# Patient Record
Sex: Male | Born: 1955 | Race: White | Hispanic: No | Smoking: Never smoker
Health system: Southern US, Community
[De-identification: ages and names within clinical notes are randomized; demographics above are authoritative.]

---

## 2014-06-28 ENCOUNTER — Ambulatory Visit (INDEPENDENT_AMBULATORY_CARE_PROVIDER_SITE_OTHER): Payer: BLUE CROSS/BLUE SHIELD

## 2014-06-28 ENCOUNTER — Ambulatory Visit (INDEPENDENT_AMBULATORY_CARE_PROVIDER_SITE_OTHER): Payer: BLUE CROSS/BLUE SHIELD | Admitting: Sports Medicine

## 2014-06-28 ENCOUNTER — Encounter: Payer: Self-pay | Admitting: Sports Medicine

## 2014-06-28 VITALS — BP 176/91 | HR 76 | Ht 64.0 in | Wt 212.0 lb

## 2014-06-28 DIAGNOSIS — M25511 Pain in right shoulder: Secondary | ICD-10-CM | POA: Diagnosis not present

## 2014-06-28 DIAGNOSIS — M7022 Olecranon bursitis, left elbow: Secondary | ICD-10-CM | POA: Diagnosis not present

## 2014-06-28 DIAGNOSIS — M65811 Other synovitis and tenosynovitis, right shoulder: Secondary | ICD-10-CM

## 2014-06-28 DIAGNOSIS — M65911 Unspecified synovitis and tenosynovitis, right shoulder: Secondary | ICD-10-CM | POA: Insufficient documentation

## 2014-06-28 MED ORDER — MELOXICAM 15 MG PO TABS: ORAL_TABLET | ORAL | Status: DC

## 2014-06-28 NOTE — Assessment & Plan Note (Signed)
This has since resolved. He does have a elbow protective sleeve that he will start wearing on the left side. If no better in several weeks I can inject the remnant of his olecranon bursa.

## 2014-06-28 NOTE — Progress Notes (Signed)
   Subjective:    I'm seeing this patient as a consultation for:  Dr. Angelena SoleWeston Saunders  CC: 2 complaints  HPI: Left elbow pain: After bumping it against a hard railing, swelled up with what sounded to be an olecranon bursitis, then self resolved. Symptoms are now mild and improving. He has not been using any elbow protection and only has mild pain when resting his left elbow on a table.  Right shoulder pain: Moderate, persistent, localized over the anterolateral shoulder, worse with overhead activities, no radiation, minimal neck pain.  Past medical history, Surgical history, Family history not pertinant except as noted below, Social history, Allergies, and medications have been entered into the medical record, reviewed, and no changes needed.   Review of Systems: No headache, visual changes, nausea, vomiting, diarrhea, constipation, dizziness, abdominal pain, skin rash, fevers, chills, night sweats, weight loss, swollen lymph nodes, body aches, joint swelling, muscle aches, chest pain, shortness of breath, mood changes, visual or auditory hallucinations.   Objective:   General: Well Developed, well nourished, and in no acute distress.  Neuro/Psych: Alert and oriented x3, extra-ocular muscles intact, able to move all 4 extremities, sensation grossly intact. Skin: Warm and dry, no rashes noted.  Respiratory: Not using accessory muscles, speaking in full sentences, trachea midline.  Cardiovascular: Pulses palpable, no extremity edema. Abdomen: Does not appear distended. Left Elbow: Unremarkable to inspection. Range of motion full pronation, supination, flexion, extension. Strength is full to all of the above directions Stable to varus, valgus stress. Negative moving valgus stress test. No discrete areas of tenderness to palpation. Ulnar nerve does not sublux. Negative cubital tunnel Tinel's. Minimally palpable shotty sized olecranon bursa. Right Shoulder: Inspection reveals no  abnormalities, atrophy or asymmetry. Palpation is normal with no tenderness over AC joint or bicipital groove. ROM is full in all planes. Rotator cuff strength normal throughout. No signs of impingement with negative Neer and Hawkin's tests, empty can. Positive lift off sign and weakness to internal rotation. Speeds and Yergason's tests normal. No labral pathology noted with negative Obrien's, negative crank, negative clunk, and good stability. Normal scapular function observed. No painful arc and no drop arm sign. No apprehension sign  Impression and Recommendations:   This case required medical decision making of moderate complexity.

## 2014-06-28 NOTE — Assessment & Plan Note (Signed)
Mobic, x-rays, physical therapy to learn exercises for a single session, then can do it at home. Return in one month, injection if no better.

## 2014-07-04 ENCOUNTER — Ambulatory Visit (INDEPENDENT_AMBULATORY_CARE_PROVIDER_SITE_OTHER): Payer: BLUE CROSS/BLUE SHIELD | Admitting: Physical Therapy

## 2014-07-04 DIAGNOSIS — M25611 Stiffness of right shoulder, not elsewhere classified: Secondary | ICD-10-CM | POA: Diagnosis not present

## 2014-07-04 DIAGNOSIS — R531 Weakness: Secondary | ICD-10-CM | POA: Diagnosis not present

## 2014-07-04 DIAGNOSIS — M436 Torticollis: Secondary | ICD-10-CM

## 2014-07-04 DIAGNOSIS — M25511 Pain in right shoulder: Secondary | ICD-10-CM | POA: Diagnosis not present

## 2014-07-04 NOTE — Patient Instructions (Signed)
Flexibility: Upper Trapezius Stretch   Gently grasp right side of head while reaching behind back with other hand. Tilt head away until a gentle stretch is felt. Hold 30____ seconds. Repeat _3___ times per set. Do ____ sets per session. Do __2__ sessions per day.  http://orth.exer.us/340   Levator Stretch   Grasp seat or sit on hand on side to be stretched. Turn head toward other side and look down. Use hand on head to gently stretch neck in that position. Hold _30___ seconds. Repeat on other side. Repeat _3___ times. Do __2__ sessions per day.  http://orth.exer.us/344   Posture - Sitting   Sit upright, head facing forward. Try using a roll to support lower back. Keep shoulders relaxed, and avoid rounded back. Keep hips level with knees. Avoid crossing legs for long periods.  .shoulderiExternal / Internal Rotator Cuff Stretch, Standing   Stand and reach one arm over head, other arm behind back. Clasp hands, if possible. Use belt or small towel if hands cannot clasp. Hold 30___ seconds. Repeat _3__ times per session. Do _2_ sessions per day.  Copyright  VHI. All rights reserved.   Robber: Scapular Retraction: Elbow Flexion (Standing)   With elbows bent to 90, pinch shoulder blades together and rotate arms out, keeping elbows bent. Repeat _10___ times per set. Do _1-3___ sets per session. Do __1__ sessions per day. May use _____ pound weights.  http://orth.exer.us/948   Copyright  VHI. All rights reserved.    Low Row: Single Arm   Face anchor in stride stance. Palm up, pull arm back while squeezing shoulder blades together. Repeat 10__ times per set. Repeat with other arm. Do _2-3_ sets per session. Do 3__ sessions per week. Anchor Height: Waist  http://tub.exer.us/71   Copyright  VHI. All rights reserved.  Press: Thumb Up (Single Arm)   Face away from anchor in stride stance, leg forward opposite exercising arm. Press arm forward with thumb up. Repeat 10  times per set.  Do _2-3_ sets per session. Do _3_ sessions per week. Anchor Height: Chest  http://tub.exer.us/17   Copyright  VHI. All rights reserved.  Rotation: External (Single Arm)   Side toward anchor in shoulder width stance with elbow bent to 90, arm across mid-section. Thumb up, pull arm away from body, keeping elbow bent. Repeat _10_ times per set. Repeat with other arm. Do _2-3_ sets per session. Do _3_ sessions per week. Anchor Height: Waist  http://tub.exer.us/115   Copyright  VHI. All rights reserved.  Rotation: Internal (Single Arm)   Side toward anchor in shoulder width stance with elbow bent to 90, forearm away from body. Thumb up, pull arm across body keeping elbow bent. Repeat _10_ times per set. Repeat with other arm. Do _2-3_ sets per session. Do _3_ sessions per week. Anchor Height: Waist  http://tub.exer.us/123      Resistive Band Rowing   With resistive band anchored in door, grasp both ends. Keeping elbows bent, pull back, squeezing shoulder blades together. Hold _3-5___ seconds. Repeat _10-30___ times. Do __1__ sessions per day. 1 http://gt2.exer.us/98   Copyright  VHI. All rights reserved.   Strengthening: Resisted Extension   Hold tubing with both hands, arms forward. Pull arms back, elbow straight. Repeat _10-30___ times per set. Do ____ sets per session. Do _1___ sessions per day.  http://orth.exer.us/833   Copyright  VHI. All rights reserved.   Solon PalmJulie Bronx Brogden, PT 06/28/2014 1:11 PM  [facility] [place] [address] [place], [place] [address]  [phone] (office) [phone] (fax)

## 2014-07-04 NOTE — Therapy (Addendum)
[place], [place], [address] Phone: [phone]   Fax:  [phone]  Physical Therapy Evaluation  Patient Details  Name: Walter Gilmore MRN: [id number] Date of Birth: 1955/08/11 Referring Provider:  Silverio Decamp,*  Encounter Date: 07/04/2014      PT End of Session - 07/04/14 0856    Visit Number 1   Number of Visits 1   Date for PT Re-Evaluation 07/04/14   PT Start Time 0856   PT Stop Time 0930   PT Time Calculation (min) 34 min   Activity Tolerance Patient tolerated treatment well   Behavior During Therapy [facility] for tasks assessed/performed      No past medical history on file.  No past surgical history on file.  There were no vitals filed for this visit.  Visit Diagnosis:  Pain in right shoulder - Plan: PT plan of care cert/re-cert  Shoulder stiffness, right - Plan: PT plan of care cert/re-cert  Stiffness of neck - Plan: PT plan of care cert/re-cert  Generalized weakness - Plan: PT plan of care cert/re-cert      Subjective Assessment - 07/04/14 0858    Subjective In November patient was putting a trampoline together and his shoulder started hurting. He now has constant pain (low level) and then sharp pains with reaching backward.   Patient Stated Goals to get rid of the pain   Currently in Pain? Yes   Pain Score 8    Pain Location Shoulder   Pain Orientation Right   Pain Descriptors / Indicators Sharp   Pain Type Chronic pain   Pain Onset More than a month ago   Pain Frequency Constant   Aggravating Factors  reaching back and up at same time   Pain Relieving Factors rest   Effect of Pain on Daily Activities taking a shower, putting on deodearant   Multiple Pain Sites No            [facility] PT Assessment - 07/04/14 0001    Assessment   Medical Diagnosis rt tenosynovitis of rt subscapularis tendon   Onset Date 01/16/14   Next MD Visit 5 weeks   Prior Therapy no    Precautions   Precautions None   Balance Screen   Has the patient fallen in the past 6 months No   Has the patient had a decrease in activity level because of a fear of falling?  No   Is the patient reluctant to leave their home because of a fear of falling?  No   Prior Function   Level of Independence Independent with basic ADLs   Observation/Other Assessments   Focus on Therapeutic Outcomes (FOTO)  48% limited (goal 33%)   Posture/Postural Control   Posture Comments rounded shoulders; compensates shoulder elevation with UT   ROM / Strength   AROM / PROM / Strength AROM;Strength   AROM   Overall AROM Comments R shoulder WNL except IR, mod tightness bil UT/Lev Scap R>L   AROM Assessment Site Shoulder   Right/Left Shoulder Right   Right Shoulder Internal Rotation 65 Degrees  PROM 75   Strength   Overall Strength Comments 5/5 except ABD strong but painful   Special Tests    Special Tests Rotator Cuff Impingement   Rotator Cuff Impingment tests Neer impingement test;Hawkins- Kennedy test   Neer Impingement test    Findings Negative   Side Right   Hawkins-Kennedy test   Findings Negative   Side Right  [facility] Adult PT Treatment/Exercise - 07/04/14 0001    Exercises   Exercises Neck;Shoulder   Neck Exercises: Theraband   Shoulder Extension 10 reps;Red   Rows 10 reps;Red   Shoulder Exercises: Seated   Other Seated Exercises Robber x 10   Shoulder Exercises: Standing   Theraband Level (Shoulder External Rotation) Level 2 (Red)  10 reps   Theraband Level (Shoulder Internal Rotation) Level 2 (Red)  x 10   Flexion Strengthening;10 reps;Theraband   Theraband Level (Shoulder Flexion) Level 1 (Yellow)   Theraband Level (Shoulder Extension) Level 2 (Red)  x10   Shoulder Exercises: Stretch   Internal Rotation Stretch 30 seconds  with towel   Neck Exercises: Stretches   Upper Trapezius Stretch 1 rep;30 seconds  bil   Levator Stretch 1 rep;30 seconds   bil                PT Education - 07/04/14 0944    Education provided Yes   Education Details HEP   Person(s) Educated Patient   Methods Explanation;Demonstration;Handout   Comprehension Verbalized understanding;Returned demonstration             PT Long Term Goals - 07/04/14 0949    PT LONG TERM GOAL #1   Title I with HEP   Time 1   Period Days   Status Achieved               Plan - 07/04/14 0944    Clinical Impression Statement Patient was a very pleasant 59 yr old male with constant right shoulder pain that is worse with horizontal abd and ER. Patient is here for a one time visit to get shoulder strengthening exercises. He presents with mild R shoulder IR limitiations and weakness of shoulder ABD due to pain. He has rounded shoulders and forward head and some tightness in his upper traps and levator scapulae muscles.    Rehab Potential Excellent   PT Frequency One time visit   PT Treatment/Interventions Patient/family education;Therapeutic exercise   PT Next Visit Plan one time viist   Consulted and Agree with Plan of Care Patient         Problem List Patient Active Problem List   Diagnosis Date Noted  . Olecranon bursitis of left elbow 06/28/2014  . Tenosynovitis of right subscapularis tendon 06/28/2014    Madelyn Flavors PT  07/04/2014, 9:53 AM  [facility] [address] [place], [place], [address] Phone: [phone]   Fax:  [phone]    PHYSICAL THERAPY DISCHARGE SUMMARY  Visits from Start of Care: 1  Current functional level related to goals / functional outcomes: unknown   Remaining deficits: unchanged   Education / Equipment: HEP Plan: Patient agrees to discharge.  Patient goals were not met. Patient is being discharged due to not returning since the last visit.  ?????     Celyn P. Helene Kelp PT, MPH 02/09/2015 12:53 PM

## 2014-07-07 ENCOUNTER — Telehealth: Payer: Self-pay

## 2014-07-07 MED ORDER — IBUPROFEN-FAMOTIDINE 800-26.6 MG PO TABS: 1.0000 | ORAL_TABLET | Freq: Three times a day (TID) | ORAL | Status: DC

## 2014-07-07 NOTE — Telephone Encounter (Signed)
Patient called stated that the Meloxicam makes him drowsy and he is requesting something else without the drowsy effects. Davieon Stockham,CMA

## 2014-07-07 NOTE — Telephone Encounter (Signed)
[facility]

## 2014-07-08 NOTE — Telephone Encounter (Signed)
Patient has been informed. Walter Gilmore,CMA  

## 2014-08-02 ENCOUNTER — Ambulatory Visit (INDEPENDENT_AMBULATORY_CARE_PROVIDER_SITE_OTHER): Payer: BLUE CROSS/BLUE SHIELD

## 2014-08-02 ENCOUNTER — Encounter: Payer: Self-pay | Admitting: Sports Medicine

## 2014-08-02 ENCOUNTER — Ambulatory Visit (INDEPENDENT_AMBULATORY_CARE_PROVIDER_SITE_OTHER): Payer: BLUE CROSS/BLUE SHIELD | Admitting: Sports Medicine

## 2014-08-02 VITALS — BP 154/87 | HR 85 | Wt 213.0 lb

## 2014-08-02 DIAGNOSIS — M7022 Olecranon bursitis, left elbow: Secondary | ICD-10-CM | POA: Diagnosis not present

## 2014-08-02 DIAGNOSIS — M65911 Unspecified synovitis and tenosynovitis, right shoulder: Secondary | ICD-10-CM

## 2014-08-02 DIAGNOSIS — M65811 Other synovitis and tenosynovitis, right shoulder: Secondary | ICD-10-CM

## 2014-08-02 DIAGNOSIS — M25522 Pain in left elbow: Secondary | ICD-10-CM

## 2014-08-02 NOTE — Assessment & Plan Note (Signed)
Injections placed into both the biceps tendon sheath , as well as the subcoracoid space.  return in one month.

## 2014-08-02 NOTE — Progress Notes (Signed)
  Subjective:    CC:  Follow-up  HPI: Right shoulder pain: localized over the deltoid, worse with resisted internal rotation, has done physical therapy for the past month with NSAIDs and only 20% improvement.  Left olecranon bursitis: After trauma, still a palpable small rounded well-defined loose body at the olecranon. No improvement despite compression.  Past medical history, Surgical history, Family history not pertinant except as noted below, Social history, Allergies, and medications have been entered into the medical record, reviewed, and no changes needed.   Review of Systems: No fevers, chills, night sweats, weight loss, chest pain, or shortness of breath.   Objective:    General: Well Developed, well nourished, and in no acute distress.  Neuro: Alert and oriented x3, extra-ocular muscles intact, sensation grossly intact.  HEENT: Normocephalic, atraumatic, pupils equal round reactive to light, neck supple, no masses, no lymphadenopathy, thyroid nonpalpable.  Skin: Warm and dry, no rashes. Cardiac: Regular rate and rhythm, no murmurs rubs or gallops, no lower extremity edema.  Respiratory: Clear to auscultation bilaterally. Not using accessory muscles, speaking in full sentences. Left Elbow: Unremarkable to inspection. Range of motion full pronation, supination, flexion, extension. Strength is full to all of the above directions Stable to varus, valgus stress. Negative moving valgus stress test. Tender to palpation over the olecranon, with a palpable divot in the ulna, and a palpable, subcentimeter, loose, rounded structure that is movable and well defined. Ulnar nerve does not sublux. Negative cubital tunnel Tinel's.  Procedure: Real-time Ultrasound Guided Injection of right biceps tendon sheath Device: GE Logiq E  Verbal informed consent obtained.  Time-out conducted.  Noted no overlying erythema, induration, or other signs of local infection.  Skin prepped in a sterile  fashion.  Local anesthesia: Topical Ethyl chloride.  With sterile technique and under real time ultrasound guidance: 25-gauge needle advanced into the biceps tendon sheath, 0.5 mL kenalog 40, 2 mL lidocaine injected easily. Care was taken to avoid intratendinous injection.  Completed without difficulty  Pain immediately resolved suggesting accurate placement of the medication.  Advised to call if fevers/chills, erythema, induration, drainage, or persistent bleeding.  Images permanently stored and available for review in the ultrasound unit.  Impression: Technically successful ultrasound guided injection.  Procedure: Real-time Ultrasound Guided Injection of right subcoracoid space Device: GE Logiq E  Verbal informed consent obtained.  Time-out conducted.  Noted no overlying erythema, induration, or other signs of local infection.  Skin prepped in a sterile fashion.  Local anesthesia: Topical Ethyl chloride.  With sterile technique and under real time ultrasound guidance: after the bicep sheath injection the needle was redirected into the subcoracoid space, and the remaining medication, 0.5 mL kenalog 40, 2 mL lidocaine was deposited into the subcoracoid space around the subscapularis tendon taking care to avoid intratendinous injection.  Completed without difficulty  Pain immediately resolved suggesting accurate placement of the medication.  Advised to call if fevers/chills, erythema, induration, drainage, or persistent bleeding.  Images permanently stored and available for review in the ultrasound unit.  Impression: Technically successful ultrasound guided injection.  Impression and Recommendations:

## 2014-08-02 NOTE — Assessment & Plan Note (Signed)
Persistence of pain with a palpable well-defined loose mass.  x-rays. We will do this before considering interventional treatment.

## 2014-09-06 ENCOUNTER — Ambulatory Visit: Payer: BLUE CROSS/BLUE SHIELD | Admitting: Sports Medicine

## 2015-05-09 ENCOUNTER — Other Ambulatory Visit: Payer: Self-pay | Admitting: Sports Medicine

## 2015-06-26 ENCOUNTER — Ambulatory Visit (INDEPENDENT_AMBULATORY_CARE_PROVIDER_SITE_OTHER): Payer: BLUE CROSS/BLUE SHIELD | Admitting: Sports Medicine

## 2015-06-26 ENCOUNTER — Encounter: Payer: Self-pay | Admitting: Sports Medicine

## 2015-06-26 ENCOUNTER — Ambulatory Visit (INDEPENDENT_AMBULATORY_CARE_PROVIDER_SITE_OTHER): Payer: BLUE CROSS/BLUE SHIELD

## 2015-06-26 VITALS — BP 152/79 | HR 87 | Resp 18 | Wt 214.9 lb

## 2015-06-26 DIAGNOSIS — N200 Calculus of kidney: Secondary | ICD-10-CM | POA: Diagnosis not present

## 2015-06-26 DIAGNOSIS — M5136 Other intervertebral disc degeneration, lumbar region: Secondary | ICD-10-CM

## 2015-06-26 DIAGNOSIS — M51369 Other intervertebral disc degeneration, lumbar region without mention of lumbar back pain or lower extremity pain: Secondary | ICD-10-CM

## 2015-06-26 DIAGNOSIS — M7918 Myalgia, other site: Secondary | ICD-10-CM | POA: Insufficient documentation

## 2015-06-26 MED ORDER — PREDNISONE 50 MG PO TABS: ORAL_TABLET | ORAL | Status: DC

## 2015-06-26 MED ORDER — MELOXICAM 15 MG PO TABS: ORAL_TABLET | ORAL | Status: DC

## 2015-06-26 MED ORDER — IBUPROFEN-FAMOTIDINE 800-26.6 MG PO TABS: 1.0000 | ORAL_TABLET | Freq: Three times a day (TID) | ORAL | Status: DC

## 2015-06-26 NOTE — Assessment & Plan Note (Signed)
Symptoms sound to be a combination of discogenic and facetogenic pain. Physical therapy, prednisone, meloxicam, baseline x-rays. Return in one month, MRI for no better, pain is predominantly axial.

## 2015-06-26 NOTE — Addendum Note (Signed)
Addended by: Baird KayUGLAS, Gabrial Poppell M on: 06/26/2015 01:57 PM   Modules accepted: Orders, Medications

## 2015-06-26 NOTE — Progress Notes (Signed)
   Subjective:    I'm seeing this patient as a consultation for:  Dr. Angelena SoleWeston Saunders  CC: Low back pain  HPI: This is a pleasant 60 year old male, he comes in with a 2 week history of worsening pain in his back after working out in the yard and carrying heavy bags of dirt. Pain is axial, worse with standing, and walking uphill. 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  Past medical history, Surgical history, Family history not pertinant except as noted below, Social history, Allergies, and medications have been entered into the medical record, reviewed, and no changes needed.   Review of Systems: No headache, visual changes, nausea, vomiting, diarrhea, constipation, dizziness, abdominal pain, skin rash, fevers, chills, night sweats, weight loss, swollen lymph nodes, body aches, joint swelling, muscle aches, chest pain, shortness of breath, mood changes, visual or auditory hallucinations.   Objective:   General: Well Developed, well nourished, and in no acute distress.  Neuro/Psych: Alert and oriented x3, extra-ocular muscles intact, able to move all 4 extremities, sensation grossly intact. Skin: Warm and dry, no rashes noted.  Respiratory: Not using accessory muscles, speaking in full sentences, trachea midline.  Cardiovascular: Pulses palpable, no extremity edema. Abdomen: Does not appear distended. Back Exam:  Inspection: Unremarkable  Motion: Flexion 45 deg, Extension 45 deg, Side Bending to 45 deg bilaterally,  Rotation to 45 deg bilaterally  SLR laying: Negative  XSLR laying: Negative  Palpable tenderness: None. FABER: negative. Sensory change: Gross sensation intact to all lumbar and sacral dermatomes.  Reflexes: 2+ at both patellar tendons, 2+ at achilles tendons, Babinski's downgoing.  Strength at foot  Plantar-flexion: 5/5 Dorsi-flexion: 5/5 Eversion: 5/5 Inversion: 5/5  Leg strength  Quad: 5/5  Hamstring: 5/5 Hip flexor: 5/5 Hip abductors: 5/5  Gait unremarkable.  Impression and Recommendations:   This case required medical decision making of moderate complexity.

## 2015-06-27 ENCOUNTER — Ambulatory Visit (INDEPENDENT_AMBULATORY_CARE_PROVIDER_SITE_OTHER): Payer: BLUE CROSS/BLUE SHIELD | Admitting: Sports Medicine

## 2015-06-27 ENCOUNTER — Encounter: Payer: Self-pay | Admitting: Sports Medicine

## 2015-06-27 VITALS — BP 143/84 | HR 83 | Resp 18 | Wt 214.0 lb

## 2015-06-27 DIAGNOSIS — M170 Bilateral primary osteoarthritis of knee: Secondary | ICD-10-CM | POA: Insufficient documentation

## 2015-06-27 DIAGNOSIS — M2241 Chondromalacia patellae, right knee: Secondary | ICD-10-CM | POA: Diagnosis not present

## 2015-06-27 NOTE — Progress Notes (Signed)
  Subjective:    CC: right knee pain  HPI: For the past several months this pleasant 60 year old male has had increasing pain that he localizes at the kneecap moderate, persistent without radiation, worse going up and down stairs, no swelling, no constitutional symptoms, no trauma.  Past medical history, Surgical history, Family history not pertinant except as noted below, Social history, Allergies, and medications have been entered into the medical record, reviewed, and no changes needed.   Review of Systems: No fevers, chills, night sweats, weight loss, chest pain, or shortness of breath.   Objective:    General: Well Developed, well nourished, and in no acute distress.  Neuro: Alert and oriented x3, extra-ocular muscles intact, sensation grossly intact.  HEENT: Normocephalic, atraumatic, pupils equal round reactive to light, neck supple, no masses, no lymphadenopathy, thyroid nonpalpable.  Skin: Warm and dry, no rashes. Cardiac: Regular rate and rhythm, no murmurs rubs or gallops, no lower extremity edema.  Respiratory: Clear to auscultation bilaterally. Not using accessory muscles, speaking in full sentences. Right Knee: Normal to inspection with no erythema or effusion or obvious bony abnormalities. Minimally swollen and tender to palpation under the lateral patellar facet ROM normal in flexion and extension and lower leg rotation. Ligaments with solid consistent endpoints including ACL, PCL, LCL, MCL. Negative Mcmurray's and provocative meniscal tests. Non painful patellar compression. Patellar and quadriceps tendons unremarkable. Hamstring and quadriceps strength is normal.  Procedure: Real-time Ultrasound Guided Injection of right knee Device: GE Logiq E  Verbal informed consent obtained.  Time-out conducted.  Noted no overlying erythema, induration, or other signs of local infection.  Skin prepped in a sterile fashion.  Local anesthesia: Topical Ethyl chloride.  With  sterile technique and under real time ultrasound guidance:  1 mL kenalog 40, 2 mL lidocaine, 2 mL Marcaine injected easily Completed without difficulty  Pain immediately resolved suggesting accurate placement of the medication.  Advised to call if fevers/chills, erythema, induration, drainage, or persistent bleeding.  Images permanently stored and available for review in the ultrasound unit.  Impression: Technically successful ultrasound guided injection.  Impression and Recommendations:    I spent 25 minutes with this patient, greater than 50% was face-to-face time counseling regarding the above diagnoses

## 2015-06-27 NOTE — Assessment & Plan Note (Signed)
With effusion, injection as above, x-rays, return when he returns to discuss his low back.

## 2015-06-30 ENCOUNTER — Ambulatory Visit: Payer: BLUE CROSS/BLUE SHIELD | Admitting: Sports Medicine

## 2015-07-03 ENCOUNTER — Ambulatory Visit (INDEPENDENT_AMBULATORY_CARE_PROVIDER_SITE_OTHER): Payer: BLUE CROSS/BLUE SHIELD

## 2015-07-03 ENCOUNTER — Ambulatory Visit (INDEPENDENT_AMBULATORY_CARE_PROVIDER_SITE_OTHER): Payer: BLUE CROSS/BLUE SHIELD | Admitting: Rehabilitative and Restorative Service Providers"

## 2015-07-03 ENCOUNTER — Other Ambulatory Visit: Payer: Self-pay | Admitting: Sports Medicine

## 2015-07-03 ENCOUNTER — Encounter: Payer: Self-pay | Admitting: Rehabilitative and Restorative Service Providers"

## 2015-07-03 DIAGNOSIS — M76891 Other specified enthesopathies of right lower limb, excluding foot: Secondary | ICD-10-CM | POA: Diagnosis not present

## 2015-07-03 DIAGNOSIS — M25562 Pain in left knee: Secondary | ICD-10-CM

## 2015-07-03 DIAGNOSIS — M25561 Pain in right knee: Secondary | ICD-10-CM | POA: Diagnosis not present

## 2015-07-03 DIAGNOSIS — M545 Low back pain, unspecified: Secondary | ICD-10-CM

## 2015-07-03 DIAGNOSIS — R531 Weakness: Secondary | ICD-10-CM

## 2015-07-03 DIAGNOSIS — M2241 Chondromalacia patellae, right knee: Secondary | ICD-10-CM

## 2015-07-03 NOTE — Patient Instructions (Addendum)
Do not sit with ankles crossed!!   TENS unit for back and knees as needed   Ice or heat at needed for pain   Abdominal Bracing With Pelvic Floor (Hook-Lying)    With neutral spine, tighten pelvic floor and abdominals sucking belly button to back bone; tighten muscles in low back at waist. Hold 10 sec  Repeat _10__ times. Do __several _ times a day.   Trunk: Prone Extension (Press-Ups)   No pain!  Lie on stomach on firm, flat surface. Relax bottom and legs. Raise chest in air with elbows straight. Keep hips flat on surface, sag stomach. Hold _2-3___ seconds. Repeat __5-10__ times. Do __2-3__ sessions per day. CAUTION: Movement should be gentle and slow.   Trunk Extension    Standing, place back of open hands on low back. Straighten spine then arch the back and move shoulders back. Repeat __2-3__ times per session. Do _several ___ sessions per week.    Sleeping on Back  Place pillow under knees. A pillow with cervical support and a roll around waist are also helpful. Copyright  VHI. All rights reserved.  Sleeping on Side Place pillow between knees. Use cervical support under neck and a roll around waist as needed. Copyright  VHI. All rights reserved.   Sleeping on Stomach   If this is the only desirable sleeping position, place pillow under lower legs, and under stomach or chest as needed.  Posture - Sitting   Sit upright, head facing forward. Try using a roll to support lower back. Keep shoulders relaxed, and avoid rounded back. Keep hips level with knees. Avoid crossing legs for long periods. Stand to Sit / Sit to Stand   To sit: Bend knees to lower self onto front edge of chair, then scoot back on seat. To stand: Reverse sequence by placing one foot forward, and scoot to front of seat. Use rocking motion to stand up.   Work Height and Reach  Ideal work height is no more than 2 to 4 inches below elbow level when standing, and at elbow level when sitting.  Reaching should be limited to arm's length, with elbows slightly bent.  Bending  Bend at hips and knees, not back. Keep feet shoulder-width apart.    Posture - Standing   Good posture is important. Avoid slouching and forward head thrust. Maintain curve in low back and align ears over shoul- ders, hips over ankles.  Alternating Positions   Alternate tasks and change positions frequently to reduce fatigue and muscle tension. Take rest breaks. Computer Work   Position work to Art gallery managerface forward. Use proper work and seat height. Keep shoulders back and down, wrists straight, and elbows at right angles. Use chair that provides full back support. Add footrest and lumbar roll as needed.  Getting Into / Out of Car  Lower self onto seat, scoot back, then bring in one leg at a time. Reverse sequence to get out.  Dressing  Lie on back to pull socks or slacks over feet, or sit and bend leg while keeping back straight.    Housework - Sink  Place one foot on ledge of cabinet under sink when standing at sink for prolonged periods.   Pushing / Pulling  Pushing is preferable to pulling. Keep back in proper alignment, and use leg muscles to do the work.  Deep Squat   Squat and lift with both arms held against upper trunk. Tighten stomach muscles without holding breath. Use smooth movements to avoid jerking.  Avoid Twisting   Avoid twisting or bending back. Pivot around using foot movements, and bend at knees if needed when reaching for articles.  Carrying Luggage   Distribute weight evenly on both sides. Use a cart whenever possible. Do not twist trunk. Move body as a unit.   Lifting Principles .Maintain proper posture and head alignment. .Slide object as close as possible before lifting. .Move obstacles out of the way. .Test before lifting; ask for help if too heavy. .Tighten stomach muscles without holding breath. .Use smooth movements; do not jerk. .Use legs to do the work,  and pivot with feet. .Distribute the work load symmetrically and close to the center of trunk. .Push instead of pull whenever possible.   Ask For Help   Ask for help and delegate to others when possible. Coordinate your movements when lifting together, and maintain the low back curve.  Log Roll   Lying on back, bend left knee and place left arm across chest. Roll all in one movement to the right. Reverse to roll to the left. Always move as one unit. Housework - Sweeping  Use long-handled equipment to avoid stooping.   Housework - Wiping  Position yourself as close as possible to reach work surface. Avoid straining your back.  Laundry - Unloading Wash   To unload small items at bottom of washer, lift leg opposite to arm being used to reach.  Gardening - Raking  Move close to area to be raked. Use arm movements to do the work. Keep back straight and avoid twisting.     Cart  When reaching into cart with one arm, lift opposite leg to keep back straight.   Getting Into / Out of Bed  Lower self to lie down on one side by raising legs and lowering head at the same time. Use arms to assist moving without twisting. Bend both knees to roll onto back if desired. To sit up, start from lying on side, and use same move-ments in reverse. Housework - Vacuuming  Hold the vacuum with arm held at side. Step back and forth to move it, keeping head up. Avoid twisting.   Laundry - Armed forces training and education officer so that bending and twisting can be avoided.   Laundry - Unloading Dryer  Squat down to reach into clothes dryer or use a reacher.  Gardening - Weeding / Psychiatric nurse or Kneel. Knee pads may be helpful.

## 2015-07-03 NOTE — Therapy (Signed)
[facility] [place] [address] [place], [place], [address] Phone: [phone]   Fax:  [phone]  Physical Therapy Evaluation  Patient Details  Name: Walter Gilmore MRN: [id number] Date of Birth: 05-Sep-1955 Referring Provider: Dr. Benjamin Stain   Encounter Date: 07/03/2015      PT End of Session - 07/03/15 1712    Visit Number 1   Number of Visits 12   Date for PT Re-Evaluation 08/14/15   PT Start Time 1517   PT Stop Time 1615   PT Time Calculation (min) 58 min   Activity Tolerance Patient tolerated treatment well      History reviewed. No pertinent past medical history.  History reviewed. No pertinent past surgical history.  There were no vitals filed for this visit.       Subjective Assessment - 07/03/15 1526    Subjective Walter Gilmore reports that he has some DDD of the mid lumbar spine per MD. He has LBP which has changed over time - LB is "sore and tired"; feels like his back is "ready to go out" when he gets to the top of a flight of stairs. Notices pain with lifting and carrying with symptoms occuring over the past 6-9 months. he has been treated whith with chiropractic care; accucpuncture; and home stretching with no significant improvement.   He has pain in both knees with Rt most noticable over the past 2 - 3 months. He noticed pain with going up and down stairs. He then began to notice pain in the Lt knee which has gotten worse in the past couple of weeks.    Pertinent History He reports that he has always had a "tight neck"   How long can you sit comfortably? no limit    How long can you stand comfortably? 45-60 min    How long can you walk comfortably? level surfaces ~30 min - on incline 2 min    Diagnostic tests x-rays - DDD in lumbar spine    Patient Stated Goals pain free in back and knees - return to normal functional and leisure activities.    Currently in Pain? Yes   Pain Score 2    Pain Location Back   Pain  Orientation Left;Mid   Pain Descriptors / Indicators Tightness   Pain Radiating Towards around waist into groin but that has resolved - now radiating into both hips/buttocks    Pain Onset More than a month ago   Pain Frequency Constant   Aggravating Factors  carrying weight; walking up inclines; worse first thing in the morning (sleeps on back and Lt side)    Pain Relieving Factors avoiding activities that irritate symptoms; sitting with back supported - straight; estim    Multiple Pain Sites Yes   Pain Score 1   Pain Location Knee   Pain Orientation Left  some pain in the Rt knee - resolved with cortisone shot last week    Pain Descriptors / Indicators Dull;Aching  with moving the wrong way wil lhave a pain    Pain Type Acute pain   Pain Radiating Towards just in the knee    Pain Onset More than a month ago   Pain Frequency Intermittent   Aggravating Factors  moving in certain directions - twisting; leaning to lift with the knee in a poor positions; getting up off the ground    Pain Relieving Factors TENS             [facility] PT Assessment -  07/03/15 0001    Assessment   Medical Diagnosis Lumbar DDD   Referring Provider Dr. Benjamin Stain    Onset Date/Surgical Date 11/03/14  Rt knee started ~ 1 yr ago on an eliptical and has cont    Hand Dominance Left   Next MD Visit 5/17   Prior Therapy chiropractic care; accupuncture;    Precautions   Precautions None   Balance Screen   Has the patient fallen in the past 6 months No   Has the patient had a decrease in activity level because of a fear of falling?  No   Is the patient reluctant to leave their home because of a fear of falling?  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    Leisure yard  work; some work around [employer]; gardening; boating; hiking and walking seasonally; has an eliptical at home    Observation/Other Assessments   Focus on Therapeutic Outcomes (FOTO)  43% limitation    Sensation   Additional Comments WFL's    Posture/Postural Control   Posture Comments head forward; shoudlers rounded and elevated; increased thoracic kyphosis; slight decrease in lumbar lordosis    AROM   Right/Left Hip --  WFL's end range tightness esp hip ext    Right Knee Extension 119   Right Knee Flexion 2   Left Knee Extension 5   Left Knee Flexion 110   Lumbar Flexion 75%   Lumbar Extension 305   Lumbar - Right Side Bend 75%   Lumbar - Left Side Bend 75%   Lumbar - Right Rotation 40%   Lumbar - Left Rotation 40%   Strength   Overall Strength Comments bilat LE's 5/5 except hip ext 4+/5 Lt; 5-/5 Rt    Flexibility   Hamstrings tight Rt ~70 deg; Lt 65deg    Quadriceps tight bilat    ITB tight bilat    Piriformis tight bilat   Palpation   Patella mobility lateral tracking Rt   Spinal mobility pain and stiffness noted with CPA mobs L3/4/5; Rt UPA mobs L4/5 > Lt    SI assessment  WFL's   Palpation comment mild tightness noted lumbar paraspinals;into bilat QL and lats as well as into the hip abductors                    [facility] Adult PT Treatment/Exercise - 07/03/15 0001    Therapeutic Activites    Therapeutic Activities --  avoid sitting at desk with ankles crossed    Lumbar Exercises: Stretches   Prone on Elbows Stretch 1 rep;20 seconds  some discomfort    Press Ups --  2-3 sec x 10 - some discomfort - partial range    Lumbar Exercises: Supine   Ab Set --  3 part core 10 sec x 10 - difficulty w/ correct form    Moist Heat Therapy   Number Minutes Moist Heat 15 Minutes   Moist Heat Location Lumbar Spine   Cryotherapy   Number Minutes Cryotherapy 15 Minutes   Cryotherapy Location Knee  Lt   Type of Cryotherapy Ice pack   Electrical Stimulation   Electrical  Stimulation Location bilat lumbar spine    Electrical Stimulation Action IFC   Electrical Stimulation Parameters  to tolerance   Electrical Stimulation Goals Pain                PT Education - 07/03/15 1619    Education provided Yes   Education Details HEP spine care    Person(s) Educated Patient   Methods Explanation;Demonstration;Tactile cues;Verbal cues;Handout   Comprehension Verbalized understanding;Returned demonstration;Verbal cues required;Tactile cues required;Need further instruction             PT Long Term Goals - 07/03/15 1718    PT LONG TERM GOAL #1   Title Improve trunk mobilty by 10% throughout 08/14/15   Time 6   Period Weeks   Status New   PT LONG TERM GOAL #2   Title Improve strength bilat LE's to 5/5 08/14/15   Time 6   Period Weeks   Status New   PT LONG TERM GOAL #3   Title Patient to be pain free in bilat knees for all functional activities including ascending and descending stairs 08/14/15   Time 6   Period Weeks   Status New   PT LONG TERM GOAL #4   Title Independent  in HEP 08/14/15   Time 6   Period Weeks   Status New   PT LONG TERM GOAL #5   Title Improve FOTO to </= 32% limitation 08/14/15   Time 6   Period Weeks   Status New               Plan - 07/03/15 1713    Clinical Impression Statement Walter Gilmore presents with history of LBP for the past 6-9 months and bilat knee pain for the past 2-3 months. He has lumbar DDD per x-ray and clinically has limited trunk and LE mobility and strength. He has poor movement patterns and posture in sitting and standing. Patient has laterally tracking patella on Rt - but no pain since receiving an injection last week. He has mild Lt knee pain. Patient sits at a desk 8+ hr/day most of the time with his ankles crossed which is likely a contributing factor to the knee pain. Patient will benefit from PT to address problems identified.    Rehab Potential Good   PT Frequency 2x / week   PT Duration 6  weeks   PT Treatment/Interventions Patient/family education;ADLs/Self Care Home Management;Therapeutic exercise;Therapeutic activities;Manual techniques;Dry needling;Cryotherapy;Electrical Stimulation;Iontophoresis /ml Dexamethasone;Moist Heat;Ultrasound;Traction;Neuromuscular re-education   PT Next Visit Plan progress exercise - stretch LE's; trial of taping for patellar tracking correction(pt will shave legs at knees) manual work and modalities as indicated; spine care education    PT Home Exercise Plan HEP; TENS which he has    Consulted and Agree with Plan of Care Patient      Patient will benefit from skilled therapeutic intervention in order to improve the following deficits and impairments:  Postural dysfunction, Improper body mechanics, Increased fascial restricitons, Decreased strength, Decreased mobility, Decreased range of motion, Decreased endurance, Decreased activity tolerance, Pain  Visit Diagnosis: Midline low back pain without sciatica - Plan: PT plan of care cert/re-cert  Pain in left knee - Plan: PT plan of care cert/re-cert  Pain in right knee - Plan: PT plan of care cert/re-cert  Generalized weakness - Plan: PT plan of care cert/re-cert     Problem List Patient Active Problem List   Diagnosis Date Noted  . Chondromalacia of right patellofemoral joint 06/27/2015  . Lumbar degenerative disc disease 06/26/2015  . Olecranon bursitis of left elbow 06/28/2014  . Tenosynovitis of right subscapularis tendon 06/28/2014  Walter Gilmore Walter Gilmore PT, MPH  07/03/2015, 5:26 PM  [facility] [address] [place], [place], [address] Phone: [phone]   Fax:  [phone]  Name: Walter ShoalsKevin Gilmore MRN: [id number] Date of Birth: [birth year]

## 2015-07-06 ENCOUNTER — Encounter: Payer: BLUE CROSS/BLUE SHIELD | Admitting: Physical Therapy

## 2015-07-07 ENCOUNTER — Encounter: Payer: Self-pay | Admitting: Rehabilitative and Restorative Service Providers"

## 2015-07-07 ENCOUNTER — Ambulatory Visit (INDEPENDENT_AMBULATORY_CARE_PROVIDER_SITE_OTHER): Payer: BLUE CROSS/BLUE SHIELD | Admitting: Rehabilitative and Restorative Service Providers"

## 2015-07-07 DIAGNOSIS — M25561 Pain in right knee: Secondary | ICD-10-CM

## 2015-07-07 DIAGNOSIS — M545 Low back pain, unspecified: Secondary | ICD-10-CM

## 2015-07-07 DIAGNOSIS — M25562 Pain in left knee: Secondary | ICD-10-CM

## 2015-07-07 DIAGNOSIS — R531 Weakness: Secondary | ICD-10-CM

## 2015-07-07 NOTE — Therapy (Signed)
[facility] [place] [address] [place] [address] [address] [place], [place], [address] Phone: [phone]   Fax:  [phone]  Physical Therapy Treatment  Patient Details  Name: Walter Gilmore MRN: [id number] Date of Birth: October 30, 1955 Referring Provider: Dr. Benjamin Stainhekkekandam   Encounter Date: 07/07/2015      PT End of Session - 07/07/15 1332    Visit Number 2   Number of Visits 12   Date for PT Re-Evaluation 08/14/15   PT Start Time 1332   PT Stop Time 1415   PT Time Calculation (min) 43 min   Activity Tolerance Patient tolerated treatment well      History reviewed. No pertinent past medical history.  History reviewed. No pertinent past surgical history.  There were no vitals filed for this visit.      Subjective Assessment - 07/07/15 1332    Subjective Felt pretty good Wednesday but not yesterday/ He did take a long walk yesterday b/c he was feeling better. Exercises seem to have made him sore    Currently in Pain? Yes   Pain Score 3    Pain Location Back   Pain Orientation Left;Mid   Pain Descriptors / Indicators Tightness   Pain Score 4   Pain Location Knee   Pain Orientation Left;Right   Pain Descriptors / Indicators Aching                         [facility] Adult PT Treatment/Exercise - 07/07/15 0001    Lumbar Exercises: Stretches   Passive Hamstring Stretch 3 reps;30 seconds   Prone on Elbows Stretch 1 rep;20 seconds  some discomfort    Press Ups --  2-3 sec x 10 - some discomfort - partial range    ITB Stretch 2 reps;30 seconds   Lumbar Exercises: Supine   Ab Set --  3 part core 10 sec x 10 - difficulty w/ correct form    Knee/Hip Exercises: Standing   Forward Step Up 10 reps;Right;Left   Knee/Hip Exercises: Supine   Quad Sets Right;Left;10 reps  10 sec hold    Straight Leg Raise with External Rotation Right;Left;2 sets;5 reps  3 sec hold    Cryotherapy   Number Minutes Cryotherapy 15 Minutes   Cryotherapy  Location Knee  Lt   Type of Cryotherapy Ice pack   Electrical Stimulation   Electrical Stimulation Location bilat lumbar spine    Electrical Stimulation Action IFC   Electrical Stimulation Parameters to tolerance    Electrical Stimulation Goals Pain   Manual Therapy   Kinesiotex Psychologist, educationalCreate Space;Inhibit Muscle;Facilitate Muscle  patellar alignment                 PT Education - 07/07/15 1357    Education provided Yes   Education Details HEP   Person(s) Educated Patient   Methods Explanation;Demonstration;Tactile cues;Verbal cues;Handout   Comprehension Verbalized understanding;Returned demonstration;Verbal cues required;Tactile cues required             PT Long Term Goals - 07/07/15 1404    PT LONG TERM GOAL #1   Title Improve trunk mobilty by 10% throughout 08/14/15   Time 6   Period Weeks   Status On-going   PT LONG TERM GOAL #2   Title Improve strength bilat LE's to 5/5 08/14/15   Time 6   Period Weeks   Status On-going   PT LONG TERM GOAL #3   Title Patient to be pain free in bilat knees for all  functional activities including ascending and descending stairs 08/14/15   Time 6   Period Weeks   Status On-going   PT LONG TERM GOAL #4   Title Independent  in HEP 08/14/15   Time 6   Period Weeks   Status On-going   PT LONG TERM GOAL #5   Title Improve FOTO to </= 32% limitation 08/14/15   Time 6   Period Weeks   Status On-going               Plan - 07/07/15 1402    Clinical Impression Statement Walter Gilmore tolerated additional exercises with minimal pain and some "stinging" in the knee cap area. Responded well to taping. No goals accomplished. 2nd visit.    Rehab Potential Good   PT Frequency 2x / week   PT Duration 6 weeks   PT Treatment/Interventions Patient/family education;ADLs/Self Care Home Management;Therapeutic exercise;Therapeutic activities;Manual techniques;Dry needling;Cryotherapy;Electrical Stimulation;Iontophoresis /ml Dexamethasone;Moist  Heat;Ultrasound;Traction;Neuromuscular re-education   PT Next Visit Plan progress exercise - stretch LE's;assess trial of taping for patellar tracking correction(pt will shave legs at knees) manual work and modalities as indicated; spine care education    PT Home Exercise Plan HEP; TENS which he has    Consulted and Agree with Plan of Care Patient      Patient will benefit from skilled therapeutic intervention in order to improve the following deficits and impairments:  Postural dysfunction, Improper body mechanics, Increased fascial restricitons, Decreased strength, Decreased mobility, Decreased range of motion, Decreased endurance, Decreased activity tolerance, Pain  Visit Diagnosis: Midline low back pain without sciatica  Pain in left knee  Pain in right knee  Generalized weakness     Problem List Patient Active Problem List   Diagnosis Date Noted  . Chondromalacia of right patellofemoral joint 06/27/2015  . Lumbar degenerative disc disease 06/26/2015  . Olecranon bursitis of left elbow 06/28/2014  . Tenosynovitis of right subscapularis tendon 06/28/2014    Celyn Rober Minion PT, MPH  07/07/2015, 2:05 PM  [facility] [address] [place], [place], [address] Phone: [phone]   Fax:  [phone]  Name: Walter Gilmore MRN: [id number] Date of Birth: [birth year]

## 2015-07-07 NOTE — Patient Instructions (Signed)
Quad Sets    Slowly tighten thigh muscles of straight, left leg while counting out loud to _10___. Relax. Repeat _10___ times. Do __2__ sessions per day.  Straight Leg Raise: With External Leg Rotation    Lie on back with right leg straight, opposite leg bent. Rotate straight leg out and lift _10-12___ inches. Hold 3-5 sec Repeat __10__ times per set. Do __2__ sets per day.   HIP: Hamstrings - Supine    Place strap around foot. Raise leg up, keep knee straight. Hold _30__ seconds. __3_ reps per set, __2_ sets per day,   Outer Hip Stretch: Reclined IT Band Stretch (Strap)    Strap around opposite foot, pull across only as far as possible with shoulders on mat. Hold for __30 sec. Repeat __3__ times each leg. 2 times/day   Forward    Facing step, place one leg on step, flexed at hip. Step up slowly, bringing hips in line with knee and shoulder. Bring other foot onto step. Reverse process to step back down. Repeat with other leg. Do __10__ repetitions, _1-3___ sets. 2 times/day   http://bt.exer.us/154   Copyright  VHI. All rights reserved.

## 2015-07-10 ENCOUNTER — Telehealth: Payer: Self-pay

## 2015-07-10 MED ORDER — TRAMADOL HCL 50 MG PO TABS: ORAL_TABLET | ORAL | Status: DC

## 2015-07-10 NOTE — Telephone Encounter (Signed)
Pt left msg on VM stating pain in his knee is getting worse and he's unable to complete any task without significant pain. Would like to know what else he can take to help with the pain, please advise.

## 2015-07-10 NOTE — Telephone Encounter (Signed)
Were called in physical therapy,prescription for tramadol is in my box

## 2015-07-11 NOTE — Telephone Encounter (Signed)
Pt notified. No further questions or concerns.

## 2015-07-12 ENCOUNTER — Ambulatory Visit (INDEPENDENT_AMBULATORY_CARE_PROVIDER_SITE_OTHER): Payer: BLUE CROSS/BLUE SHIELD | Admitting: Physical Therapy

## 2015-07-12 DIAGNOSIS — M25561 Pain in right knee: Secondary | ICD-10-CM

## 2015-07-12 DIAGNOSIS — M25562 Pain in left knee: Secondary | ICD-10-CM | POA: Diagnosis not present

## 2015-07-12 DIAGNOSIS — R531 Weakness: Secondary | ICD-10-CM | POA: Diagnosis not present

## 2015-07-12 DIAGNOSIS — M545 Low back pain, unspecified: Secondary | ICD-10-CM

## 2015-07-12 NOTE — Therapy (Addendum)
[facility] [address] , [place], [address] Phone: [phone]   Fax:  [phone]  Physical Therapy Treatment  Patient Details  Name: Walter Gilmore MRN: [id number] Date of Birth: 07/17/1955 Referring Provider: Dr. Thekkekandam   Encounter Date: 07/12/2015      PT End of Session - 07/12/15 1026    Visit Number 3   Number of Visits 12   Date for PT Re-Evaluation 08/14/15   PT Start Time 1019   PT Stop Time 1119   PT Time Calculation (min) 60 min   Activity Tolerance Patient tolerated treatment well;No increased pain      No past medical history on file.  No past surgical history on file.  There were no vitals filed for this visit.      Subjective Assessment - 07/12/15 1026    Subjective Pt has started taking Tramadol, "I feel happy today", pt reports it has helped ease his back pain.  Pt believes the exercises have helped quite a bit.     Patient Stated Goals pain free in back and knees - return to normal functional and leisure activities.    Currently in Pain? Yes   Pain Score 1    Pain Location Back   Pain Orientation Left;Lower   Pain Descriptors / Indicators Sore           [facility] Adult PT Treatment/Exercise - 07/12/15 0001    Lumbar Exercises: Stretches   Passive Hamstring Stretch 3 reps;30 seconds   Press Ups --  2-3 sec hold, 10 reps.    Press Ups Limitations improved range, elbows now straight.    Quad Stretch 2 reps;30 seconds   ITB Stretch 2 reps;30 seconds   Lumbar Exercises: Aerobic   Stationary Bike NuStep L3: 6 min    Lumbar Exercises: Supine   Ab Set 10 reps;5 seconds   Clam 5 reps;1 second  with ab set   Other Supine Lumbar Exercises educated in log roll into supine to/from sitting - pt returned demo   Moist Heat Therapy   Number Minutes Moist Heat 15 Minutes   Moist Heat Location Lumbar Spine   Cryotherapy   Number Minutes Cryotherapy --  will ice at home.     Cryotherapy Location --   Type of Cryotherapy --   Electrical Stimulation   Electrical Stimulation Location bilat lumbar spine    Electrical Stimulation Action IFC   Electrical Stimulation Parameters to tolerance    Electrical Stimulation Goals Pain   Manual Therapy   Kinesiotex Create Space;Inhibit Muscle;Facilitate Muscle  patellar alignment - Dynamic tape applied to bilateral knees on lateral knee and pulling medially.                 PT Education - 07/12/15 1117    Education provided Yes   Education Details HEP    Person(s) Educated Patient   Methods Handout;Explanation;Demonstration   Comprehension Verbalized understanding;Returned demonstration             PT Long Term Goals - 07/07/15 1404    PT LONG TERM GOAL #1   Title Improve trunk mobilty by 10% throughout 08/14/15   Time 6   Period Weeks   Status On-going   PT LONG TERM GOAL #2   Title Improve strength bilat LE's to 5/5 08/14/15   Time 6   Period Weeks   Status On-going   PT LONG TERM GOAL #3   Title Patient to be pain free in bilat   knees for all functional activities including ascending and descending stairs 08/14/15   Time 6   Period Weeks   Status On-going   PT LONG TERM GOAL #4   Title Independent  in HEP 08/14/15   Time 6   Period Weeks   Status On-going   PT LONG TERM GOAL #5   Title Improve FOTO to </= 32% limitation 08/14/15   Time 6   Period Weeks   Status On-going               Plan - 07/12/15 1314    Clinical Impression Statement Pt tolerated all exercises without increase in symptoms.  Pt required multiple for proper engagment of core muscles.  Progressing towards goals. Pt voiced concern of financial burden with co-pays and may be interested in decreased frequency.     Rehab Potential Good   PT Frequency 2x / week   PT Duration 6 weeks   PT Treatment/Interventions Patient/family education;ADLs/Self Care Home Management;Therapeutic exercise;Therapeutic activities;Manual  techniques;Dry needling;Cryotherapy;Electrical Stimulation;Iontophoresis 4mg/ml Dexamethasone;Moist Heat;Ultrasound;Traction;Neuromuscular re-education   PT Next Visit Plan Assess response to 2nd application of tape to knees.  Progress HEP to include standing LE/ core strengthening.  Continue spine care education.    Consulted and Agree with Plan of Care Patient      Patient will benefit from skilled therapeutic intervention in order to improve the following deficits and impairments:  Postural dysfunction, Improper body mechanics, Increased fascial restricitons, Decreased strength, Decreased mobility, Decreased range of motion, Decreased endurance, Decreased activity tolerance, Pain  Visit Diagnosis: Midline low back pain without sciatica  Pain in left knee  Pain in right knee  Generalized weakness     Problem List Patient Active Problem List   Diagnosis Date Noted  . Chondromalacia of right patellofemoral joint 06/27/2015  . Lumbar degenerative disc disease 06/26/2015  . Olecranon bursitis of left elbow 06/28/2014  . Tenosynovitis of right subscapularis tendon 06/28/2014   Jennifer Carlson-Long, PTA 07/12/2015 1:19 PM  [facility] [address] [place], [place], [address] Phone: [phone]   Fax:  [phone]  Name: Jarom Johansson MRN: [id number] Date of Birth: 07/31/1955    PHYSICAL THERAPY DISCHARGE SUMMARY  Visits from Start of Care: 3  Current functional level related to goals / functional outcomes: Good improvement in symptoms - related to meds and exercise per patient report.    Remaining deficits: Poor core stability; continued lumbar and knee pain.   Education / Equipment: HEP  Plan: Patient agrees to discharge.  Patient goals were partially met. Patient is being discharged due to not returning since the last visit.  ?????    Celyn P. Holt PT, MPH 09/06/2015 12:54 PM    

## 2015-07-12 NOTE — Patient Instructions (Signed)
KNEE: Quadriceps - Prone    Place strap around ankle while in sitting, then lay on stomach. Bring ankle toward buttocks. Press hip into surface. Hold __30_ seconds. __2_ reps per set, __2_ sets per day, _5-7__ days per week   Abdominal Bracing with following exercises:   Knee to Chest: Transverse Plane Stability   Bring one knee up, then return. Be sure pelvis does not roll side to side. Keep pelvis still. Lift knee __10_ times each leg. Restabilize pelvis. Repeat with other leg. Do _1-2__ sets, _1__ times per day.   (imagine trying not to spill bowl of soup on stomach)  Hip External Rotation With Pillow: Transverse Plane Stability   One knee bent, one leg straight, on pillow. Slowly roll bent knee out. Be sure pelvis does not rotate. Do _10__ times. Restabilize pelvis. Repeat with other leg. Do _1-2__ sets, _1__ times per day.  (imagine trying not to spill bowl of soup on stomach)  [facility] [address] [place], [place] [address]  [phone] (office) [phone] (fax)

## 2015-07-20 ENCOUNTER — Encounter: Payer: BLUE CROSS/BLUE SHIELD | Admitting: Physical Therapy

## 2015-07-24 ENCOUNTER — Encounter: Payer: Self-pay | Admitting: Sports Medicine

## 2015-07-24 ENCOUNTER — Telehealth: Payer: Self-pay | Admitting: Sports Medicine

## 2015-07-24 ENCOUNTER — Ambulatory Visit (INDEPENDENT_AMBULATORY_CARE_PROVIDER_SITE_OTHER): Payer: BLUE CROSS/BLUE SHIELD | Admitting: Sports Medicine

## 2015-07-24 DIAGNOSIS — M170 Bilateral primary osteoarthritis of knee: Secondary | ICD-10-CM

## 2015-07-24 DIAGNOSIS — M5136 Other intervertebral disc degeneration, lumbar region: Secondary | ICD-10-CM | POA: Diagnosis not present

## 2015-07-24 DIAGNOSIS — M51369 Other intervertebral disc degeneration, lumbar region without mention of lumbar back pain or lower extremity pain: Secondary | ICD-10-CM

## 2015-07-24 MED ORDER — DICLOFENAC SODIUM 2 % TD SOLN: 2.0000 | Freq: Two times a day (BID) | TRANSDERMAL | Status: DC

## 2015-07-24 MED ORDER — DICLOFENAC SODIUM 75 MG PO TBEC: 75.0000 mg | DELAYED_RELEASE_TABLET | Freq: Two times a day (BID) | ORAL | Status: AC

## 2015-07-24 NOTE — Telephone Encounter (Signed)
-----   Message from Monica Bectonhomas J Thekkekandam, MD sent at 07/24/2015  2:28 PM EDT ----- Orthovisc approval for both knees, please. ___________________________________________ Ihor Austinhomas J. Benjamin Stainhekkekandam, M.D., ABFM., CAQSM. Primary Care and Sports Medicine [facility]  Adjunct Instructor of Family Medicine  [employer]

## 2015-07-24 NOTE — Assessment & Plan Note (Signed)
Switching to oral diclofenac and topical diclofenac. He is failed steroid injections and has x-ray confirmed bilateral osteoarthritis, we are going to proceed with Orthovisc approval.

## 2015-07-24 NOTE — Telephone Encounter (Signed)
Submitted for approval on Orthovisc. Awaiting confirmation.  

## 2015-07-24 NOTE — Assessment & Plan Note (Signed)
Persistent axial disc again pain, proceeding with MRI.

## 2015-07-24 NOTE — Progress Notes (Signed)
  Subjective:    CC: Follow-up  HPI: Bilateral knee osteoarthritis: Initially did well after injection but having a recurrence of pain under the kneecap, currently working with physical therapy. Has failed NSAIDs as well.  Back pain: Discogenic and facetogenic pain, persistent despite formal physical therapy.  Past medical history, Surgical history, Family history not pertinant except as noted below, Social history, Allergies, and medications have been entered into the medical record, reviewed, and no changes needed.   Review of Systems: No fevers, chills, night sweats, weight loss, chest pain, or shortness of breath.   Objective:    General: Well Developed, well nourished, and in no acute distress.  Neuro: Alert and oriented x3, extra-ocular muscles intact, sensation grossly intact.  HEENT: Normocephalic, atraumatic, pupils equal round reactive to light, neck supple, no masses, no lymphadenopathy, thyroid nonpalpable.  Skin: Warm and dry, no rashes. Cardiac: Regular rate and rhythm, no murmurs rubs or gallops, no lower extremity edema.  Respiratory: Clear to auscultation bilaterally. Not using accessory muscles, speaking in full sentences.  Impression and Recommendations:    I spent 25 minutes with this patient, greater than 50% was face-to-face time counseling regarding the above diagnoses

## 2015-07-25 NOTE — Telephone Encounter (Signed)
Called [employer] [phone]) and spoke with Amber C (07/25/15 at 10:03am). No pre-determination or prior authorization required per plan. Provider may buy and bill for OV.   Patient notified and transferred to scheduling for appointment.

## 2015-07-25 NOTE — Telephone Encounter (Signed)
Received the following information from OV benefits investigation:  Patient has self funded PPO plan with an effective date of 03/04/2010. As of 12/02/2013 ha injections are subject to review under drug policy 29. Medical policy criteria can be accessed on [employer], or a pre-determination can be obtained by calling 866- [id number]. [id number] is covered at 80% & [id number] is covered at 80% office visits are covered at 80% of the contracted rate when performed in an office setting. *Deductible must be met for coverage to apply. If out of pocket is met, coverage goes to 100%. Ref# [id number]  Spoke with Pt and advised of estimated OOP cost. Pt states he would like to proceed. Will contact insurance for authorization.

## 2015-07-25 NOTE — Telephone Encounter (Signed)
Awesome thank you!

## 2015-07-26 ENCOUNTER — Other Ambulatory Visit: Payer: Self-pay | Admitting: Sports Medicine

## 2015-07-28 ENCOUNTER — Encounter: Payer: BLUE CROSS/BLUE SHIELD | Admitting: Physical Therapy

## 2015-08-01 ENCOUNTER — Encounter (INDEPENDENT_AMBULATORY_CARE_PROVIDER_SITE_OTHER): Payer: Self-pay

## 2015-08-01 ENCOUNTER — Telehealth: Payer: Self-pay

## 2015-08-01 ENCOUNTER — Ambulatory Visit (INDEPENDENT_AMBULATORY_CARE_PROVIDER_SITE_OTHER): Payer: BLUE CROSS/BLUE SHIELD

## 2015-08-01 ENCOUNTER — Other Ambulatory Visit: Payer: Self-pay | Admitting: Sports Medicine

## 2015-08-01 DIAGNOSIS — M545 Low back pain: Secondary | ICD-10-CM

## 2015-08-01 DIAGNOSIS — M5136 Other intervertebral disc degeneration, lumbar region: Secondary | ICD-10-CM

## 2015-08-01 NOTE — Telephone Encounter (Signed)
Pt left VM stating his pain is increasing and is making it hard for him to concentrate or stay focused at work. Would like to know your recommendations from his MRI today. Please assist.

## 2015-08-02 NOTE — Telephone Encounter (Signed)
MRI is negative, there is nothing structurally wrong with his back, so we need to push long term aggressive physical therapy, and probably work on using some different medications.

## 2015-08-03 ENCOUNTER — Ambulatory Visit (INDEPENDENT_AMBULATORY_CARE_PROVIDER_SITE_OTHER): Payer: BLUE CROSS/BLUE SHIELD | Admitting: Sports Medicine

## 2015-08-03 VITALS — BP 147/80 | HR 90 | Resp 18 | Wt 202.0 lb

## 2015-08-03 DIAGNOSIS — M170 Bilateral primary osteoarthritis of knee: Secondary | ICD-10-CM | POA: Diagnosis not present

## 2015-08-03 DIAGNOSIS — M791 Myalgia: Secondary | ICD-10-CM

## 2015-08-03 DIAGNOSIS — M7918 Myalgia, other site: Secondary | ICD-10-CM

## 2015-08-03 MED ORDER — DICLOFENAC SODIUM 2 % TD SOLN: 2.0000 | Freq: Two times a day (BID) | TRANSDERMAL | Status: AC

## 2015-08-03 MED ORDER — NALOXEGOL OXALATE 12.5 MG PO TABS: 12.5000 mg | ORAL_TABLET | Freq: Every day | ORAL | Status: AC

## 2015-08-03 MED ORDER — GABAPENTIN 300 MG PO CAPS: ORAL_CAPSULE | ORAL | Status: AC

## 2015-08-03 NOTE — Progress Notes (Signed)
  Subjective:    CC: Follow-up  HPI: White spread pain: Lumbar spine MRI was completely negative, he endorses pain in his low back on both sides with 3 patient to the hips and anterior thighs without an identifiable source of the pain in any of his appointment so far, he has no pain over the SI joints, no pain referable to the hip joint and nothing in his lumbar spine on MRI. We have had him on tramadol and NSAIDs, his topical diclofenac has helped his knee pain, and he needs refill.  Past medical history, Surgical history, Family history not pertinant except as noted below, Social history, Allergies, and medications have been entered into the medical record, reviewed, and no changes needed.   Review of Systems: No fevers, chills, night sweats, weight loss, chest pain, or shortness of breath.   Objective:    General: Well Developed, well nourished, and in no acute distress.  Neuro: Alert and oriented x3, extra-ocular muscles intact, sensation grossly intact.  HEENT: Normocephalic, atraumatic, pupils equal round reactive to light, neck supple, no masses, no lymphadenopathy, thyroid nonpalpable.  Skin: Warm and dry, no rashes. Cardiac: Regular rate and rhythm, no murmurs rubs or gallops, no lower extremity edema.  Respiratory: Clear to auscultation bilaterally. Not using accessory muscles, speaking in full sentences. Back Exam:  Inspection: Unremarkable  Motion: Flexion 45 deg, Extension 45 deg, Side Bending to 45 deg bilaterally,  Rotation to 45 deg bilaterally  SLR laying: Negative  XSLR laying: Negative  Palpable tenderness: None. FABER: negative. Sensory change: Gross sensation intact to all lumbar and sacral dermatomes.  Reflexes: 2+ at both patellar tendons, 2+ at achilles tendons, Babinski's downgoing.  Strength at foot  Plantar-flexion: 5/5 Dorsi-flexion: 5/5 Eversion: 5/5 Inversion: 5/5  Leg strength  Quad: 5/5 Hamstring: 5/5 Hip flexor: 5/5 Hip abductors: 5/5  Gait  unremarkable. Bilateral hips: ROM IR: 60 Deg, ER: 60 Deg, Flexion: 120 Deg, Extension: 100 Deg, Abduction: 45 Deg, Adduction: 45 Deg Strength IR: 5/5, ER: 5/5, Flexion: 5/5, Extension: 5/5, Abduction: 5/5, Adduction: 5/5 Pelvic alignment unremarkable to inspection and palpation. Standing hip rotation and gait without trendelenburg / unsteadiness. Greater trochanter without tenderness to palpation. No tenderness over piriformis. No SI joint tenderness and normal minimal SI movement.  Impression and Recommendations:    I spent 25 minutes with this patient, greater than 50% was face-to-face time counseling regarding the above diagnoses

## 2015-08-03 NOTE — Assessment & Plan Note (Addendum)
Completely normal lumbar spine MRI. Symptoms are fairly atypical, I do suspect chronic pain syndrome and myofascial pain, no structural deficit so I did discuss that disability would not be an option. Starting gabapentin, we did discuss a steady down taper on tramadol with 5 pills daily for a month then 4 for a month, then 3 for a month, then 2 for a month, then 1 from him, and then stop. 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 We can start the tramadol down taper next week. We are also going to complete the workup with a nerve conduction/EMG of both lower extremities

## 2015-08-03 NOTE — Telephone Encounter (Signed)
Pt notified during appointment.

## 2015-08-03 NOTE — Assessment & Plan Note (Signed)
Refilling topical diclofenac.

## 2015-08-04 ENCOUNTER — Telehealth: Payer: Self-pay | Admitting: Sports Medicine

## 2015-08-04 ENCOUNTER — Other Ambulatory Visit: Payer: Self-pay

## 2015-08-04 DIAGNOSIS — M7918 Myalgia, other site: Secondary | ICD-10-CM

## 2015-08-04 LAB — CBC WITH DIFFERENTIAL/PLATELET
Basophils Absolute: 0 {cells}/uL (ref 0–200)
Basophils Relative: 0 %
Eosinophils Absolute: 0 cells/uL — ABNORMAL LOW (ref 15–500)
Eosinophils Relative: 0 %
HCT: 41.9 % (ref 38.5–50.0)
Hemoglobin: 14.3 g/dL (ref 13.2–17.1)
Lymphocytes Relative: 16 %
Lymphs Abs: 1184 cells/uL (ref 850–3900)
MCH: 31.4 pg (ref 27.0–33.0)
MCHC: 34.1 g/dL (ref 32.0–36.0)
MCV: 91.9 fL (ref 80.0–100.0)
MPV: 10.7 fL (ref 7.5–12.5)
Monocytes Absolute: 444 cells/uL (ref 200–950)
Monocytes Relative: 6 %
Neutro Abs: 5772 cells/uL (ref 1500–7800)
Neutrophils Relative %: 78 %
Platelets: 210 K/uL (ref 140–400)
RBC: 4.56 MIL/uL (ref 4.20–5.80)
RDW: 13.9 % (ref 11.0–15.0)
WBC: 7.4 K/uL (ref 3.8–10.8)

## 2015-08-04 LAB — COMPREHENSIVE METABOLIC PANEL
AST: 28 U/L (ref 10–35)
Albumin: 4.7 g/dL (ref 3.6–5.1)
Calcium: 9.7 mg/dL (ref 8.6–10.3)
Creat: 1.36 mg/dL — ABNORMAL HIGH (ref 0.70–1.33)
Sodium: 129 mmol/L — ABNORMAL LOW (ref 135–146)
Total Bilirubin: 0.7 mg/dL (ref 0.2–1.2)
Total Protein: 7.1 g/dL (ref 6.1–8.1)

## 2015-08-04 LAB — COMPREHENSIVE METABOLIC PANEL WITH GFR
ALT: 23 U/L (ref 9–46)
Alkaline Phosphatase: 48 U/L (ref 40–115)
BUN: 20 mg/dL (ref 7–25)
CO2: 24 mmol/L (ref 20–31)
Chloride: 92 mmol/L — ABNORMAL LOW (ref 98–110)
Glucose, Bld: 96 mg/dL (ref 65–99)
Potassium: 4.3 mmol/L (ref 3.5–5.3)

## 2015-08-04 LAB — ANA: Anti Nuclear Antibody(ANA): NEGATIVE

## 2015-08-04 LAB — CK: Total CK: 275 U/L — ABNORMAL HIGH (ref 7–232)

## 2015-08-04 LAB — RHEUMATOID FACTOR: Rheumatoid fact SerPl-aCnc: <10 [IU]/mL (ref <=14)

## 2015-08-04 LAB — URIC ACID: Uric Acid, Serum: 6 mg/dL (ref 4.0–7.8)

## 2015-08-04 LAB — SEDIMENTATION RATE: Sed Rate: 1 mm/hr (ref 0–20)

## 2015-08-04 LAB — CYCLIC CITRUL PEPTIDE ANTIBODY, IGG: Cyclic Citrullin Peptide Ab: <16 U

## 2015-08-04 NOTE — Telephone Encounter (Signed)
Discussed issue with patient, he took 2 mg of Ativan as well as gabapentin and his tramadol before bedtime, CPAP noted several episodes of apnea, more than usual, and had some labored breathing at night. I discussed that these were likely side effects of the combination of Ativan and tramadol, and that we would be discontinuing his tramadol for now, I gave him a steady down taper regimen over the next 2 weeks, and we will not be providing any more tramadol refills. I was very clear that we would be treating him with gabapentin and therapy alone, as well as other neuropathic agents, but no narcotics or controlled substances. I also explained him that I was concerned that he was developing independence on his tramadol.

## 2015-08-04 NOTE — Telephone Encounter (Signed)
Pt called clinic today stating he tried the new Gabapentin Rx last night and experienced negative side effects. Pt reports he took the gabapentin, tramadol, and 2mg  Ativan prior to going to sleep. Pt felt like his breathing was "labored" and his CPAP machine stated he "stopped breathing 17 times" throughout the night. Our office was unaware Pt had Rx for Ativan, this has been added to his active Rx list. Pt was able to speak with Dr. Benjamin Stainhekkekandam and have his concerns addressed.

## 2015-08-31 ENCOUNTER — Ambulatory Visit: Payer: BLUE CROSS/BLUE SHIELD | Admitting: Sports Medicine

## 2017-03-20 IMAGING — DX DG LUMBAR SPINE COMPLETE 4+V
5 series · 5 of 5 positions shown · non-contrast
Comparison: None.

CLINICAL DATA: Low back pain for several months, no known injury,
initial encounter

EXAM:
LUMBAR SPINE - COMPLETE 4+ VIEW

[l-spine ap]
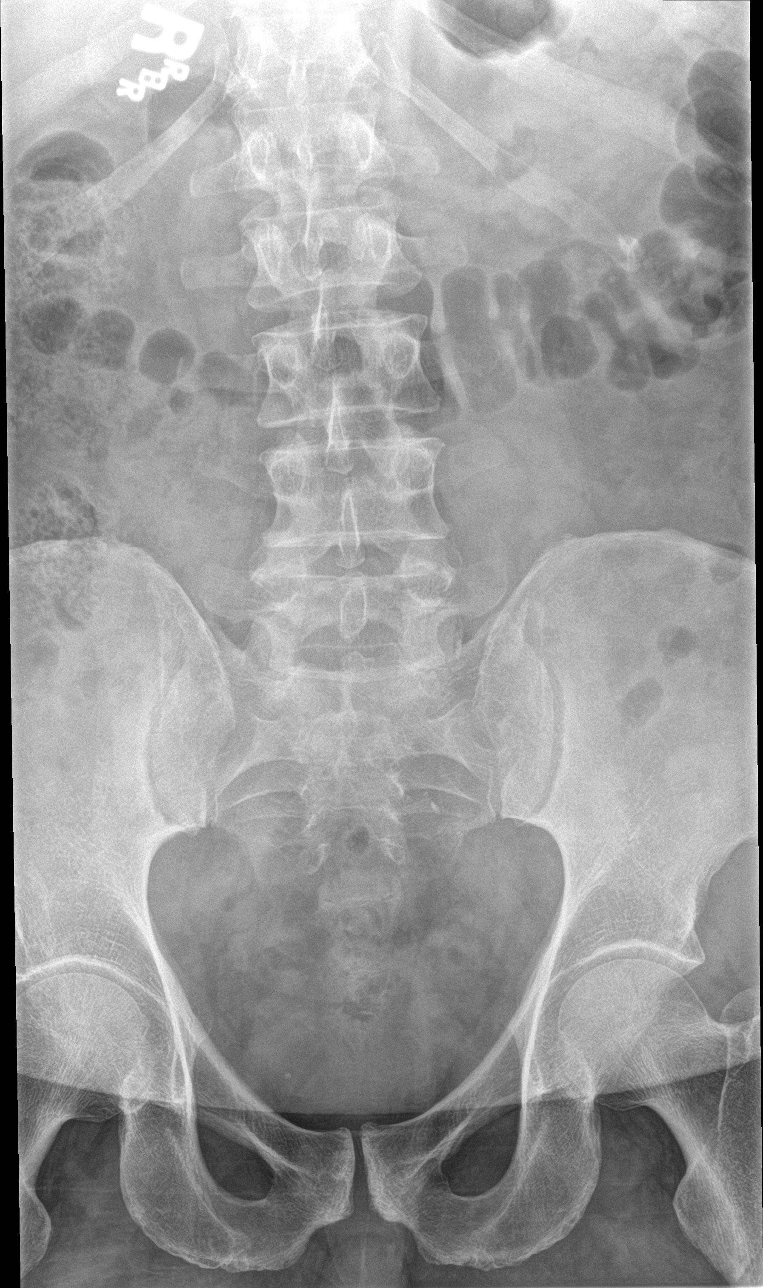

[l-spine obl (1 of 2)]
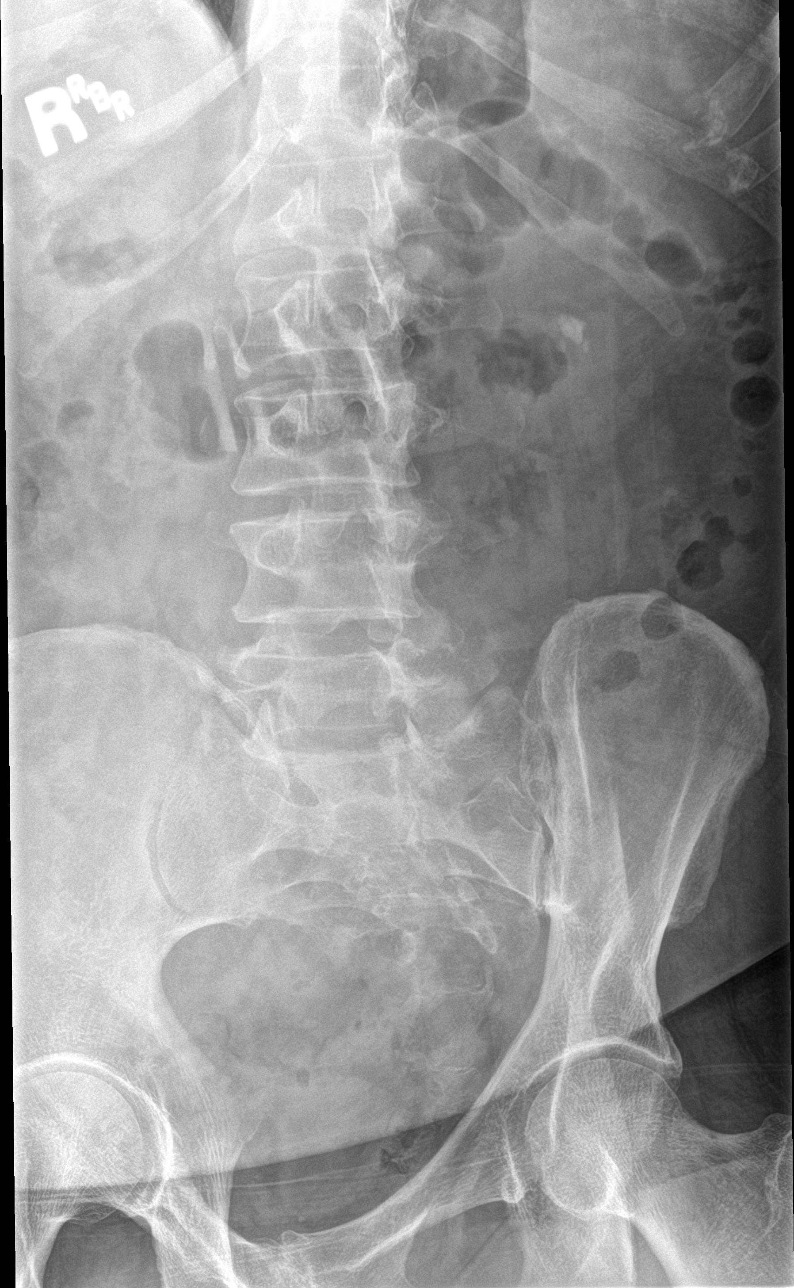

[l-spine obl (2 of 2)]
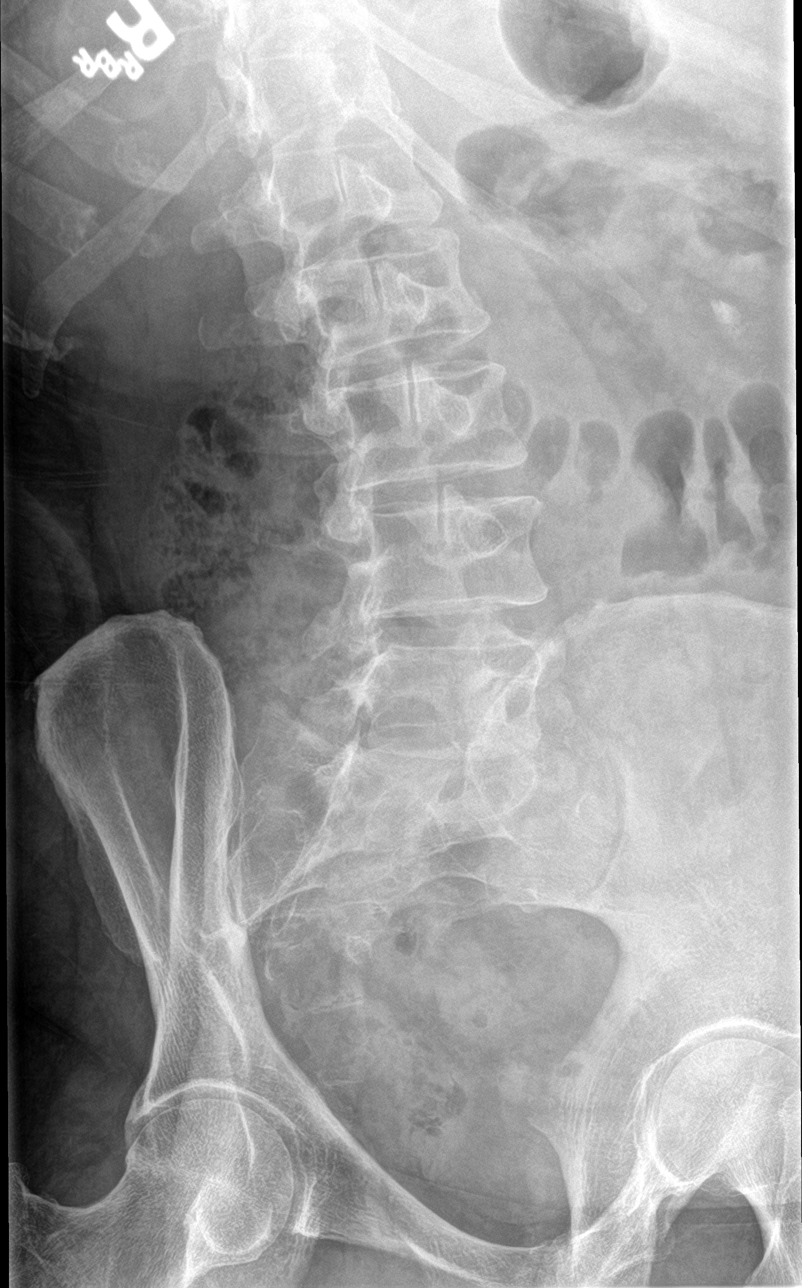

[l-spine lat]
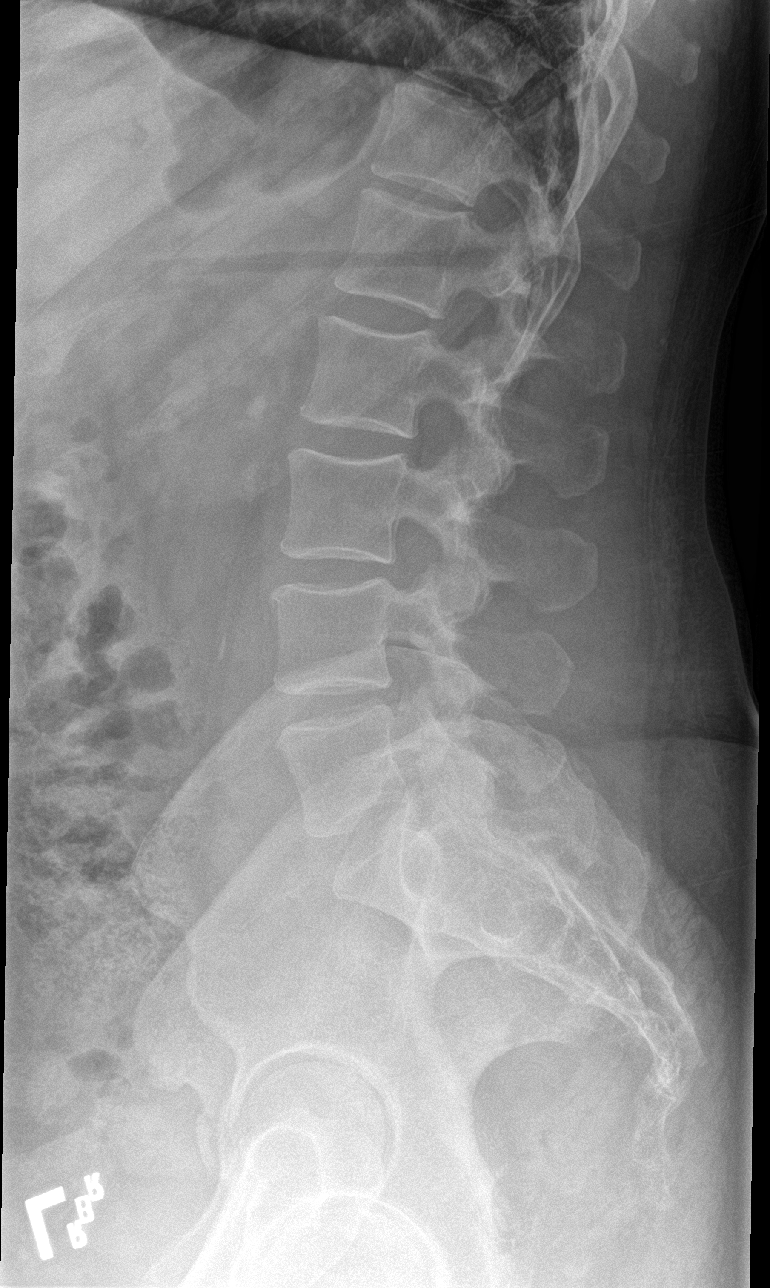

[l-spine spot]
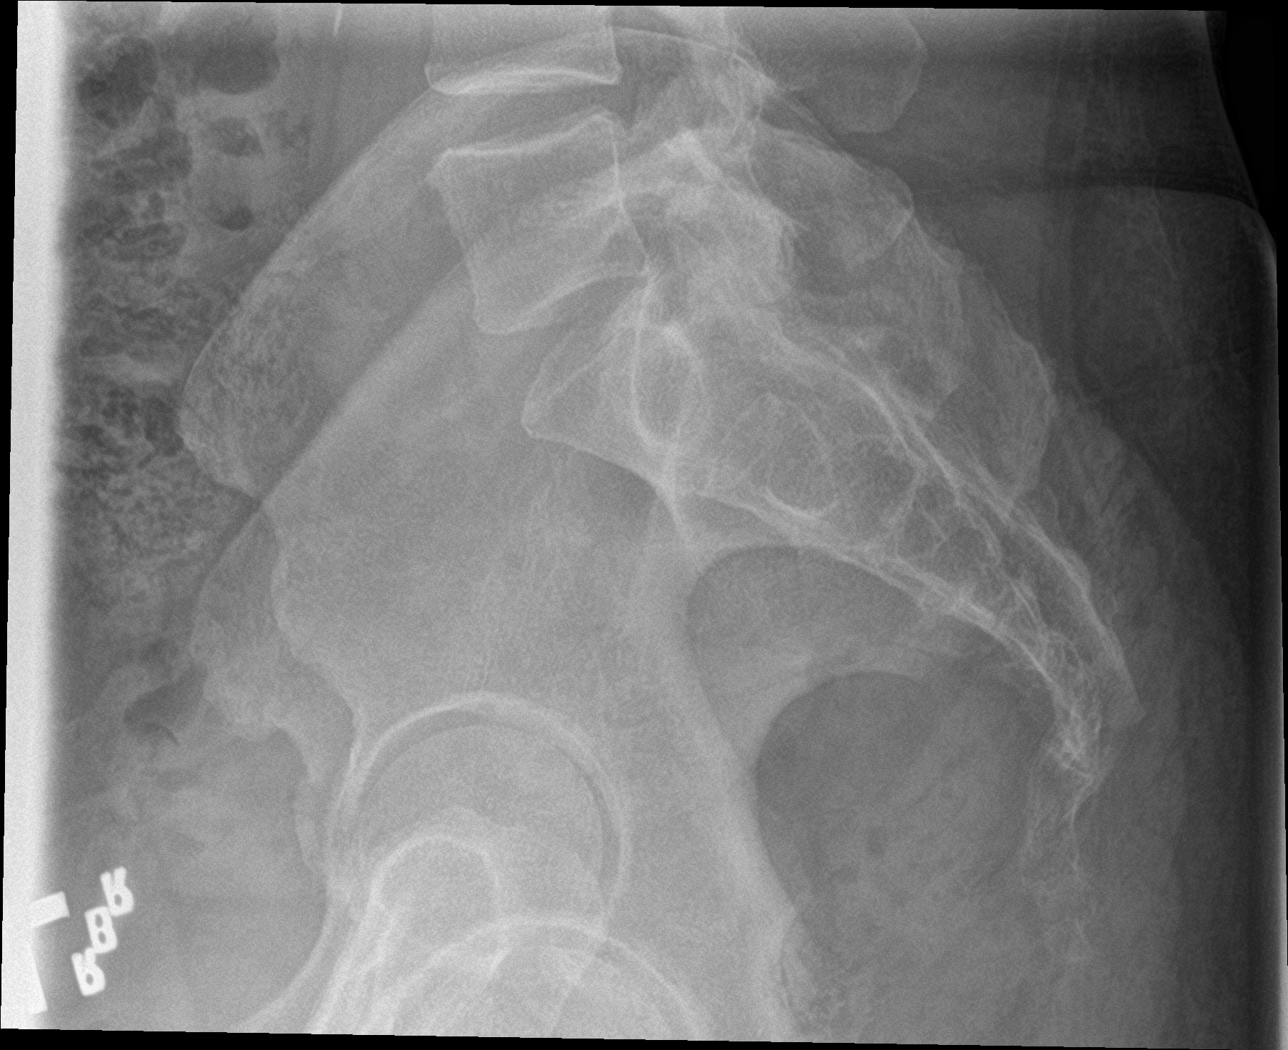

[5 of 5 positions shown; findings below may reference images not displayed]

FINDINGS: Five lumbar type vertebral bodies are well visualized. Vertebral
body height is well maintained. No pars defects are seen. No
spondylolisthesis is noted. Mild aortic calcifications are seen.
Additionally left renal stones are noted. The largest of these
measures approximately 11 mm in greatest dimension. No definitive
ureteral calculi are noted.
IMPRESSION: No acute bony abnormality is noted.  Left renal calculi are seen.

## 2017-03-27 IMAGING — DX DG KNEE COMPLETE 4+V*R*
4 series · 4 of 4 positions shown · non-contrast
Comparison: None.

CLINICAL DATA: 60-year-old male with right patellofemoral
chondromalacia. Right knee pain. Symptoms increase when walking on
stairs. Recent right knee joint injection. Medial side left knee
pain for several weeks with no known injury. Initial encounter.

EXAM:
LEFT KNEE - 1-2 VIEW; RIGHT KNEE - COMPLETE 4+ VIEW

[tunnel]
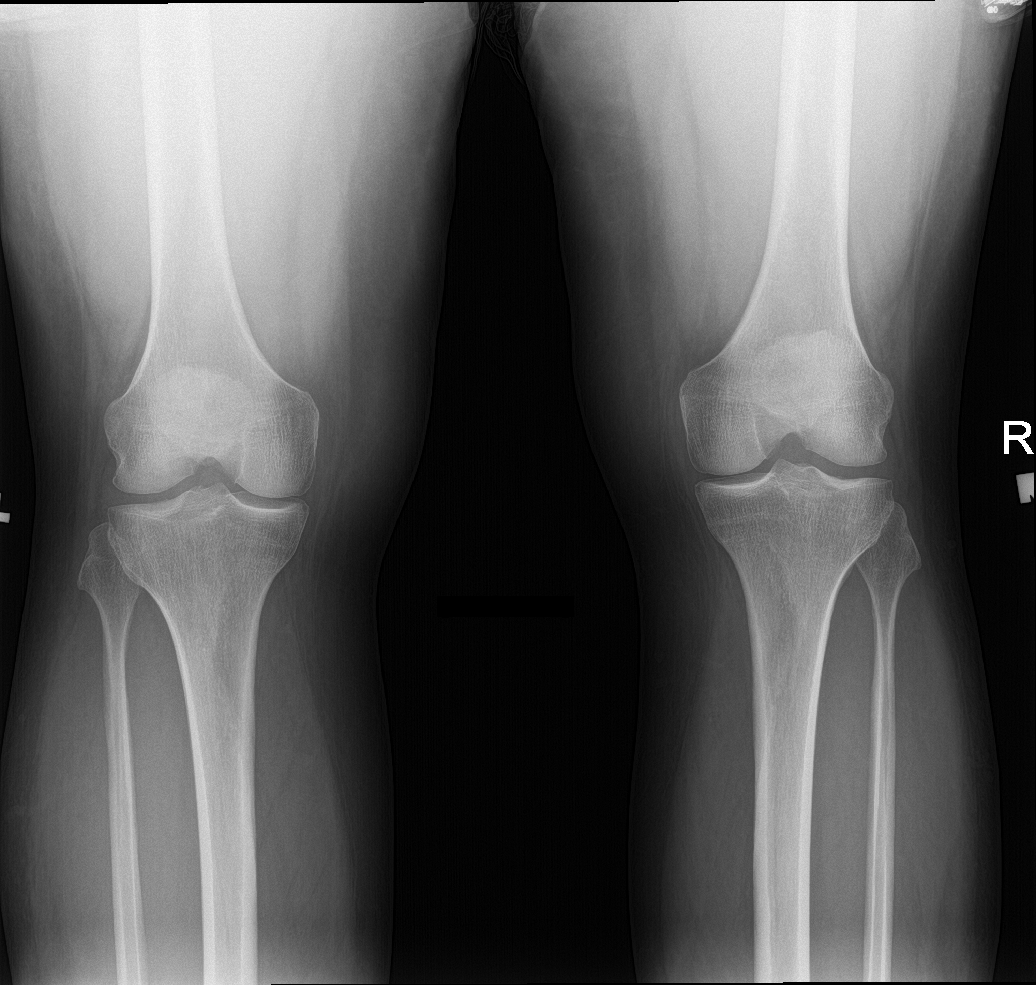

[knee lat]
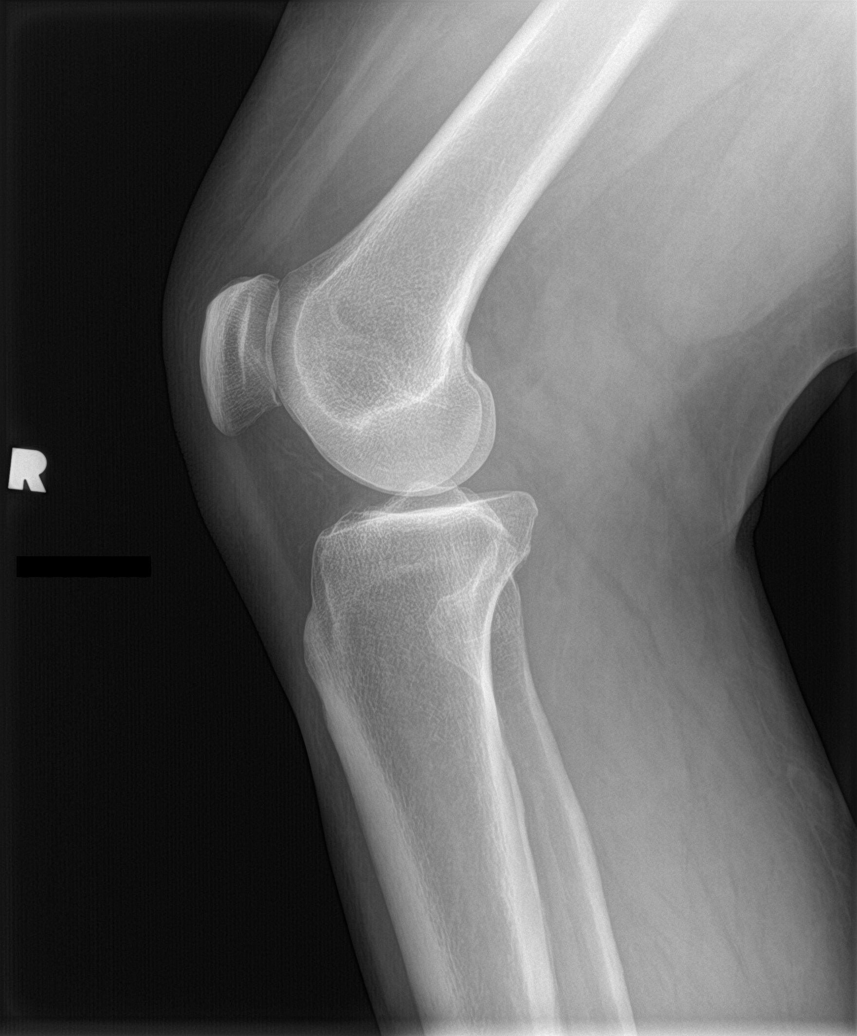

[knee sunrise]
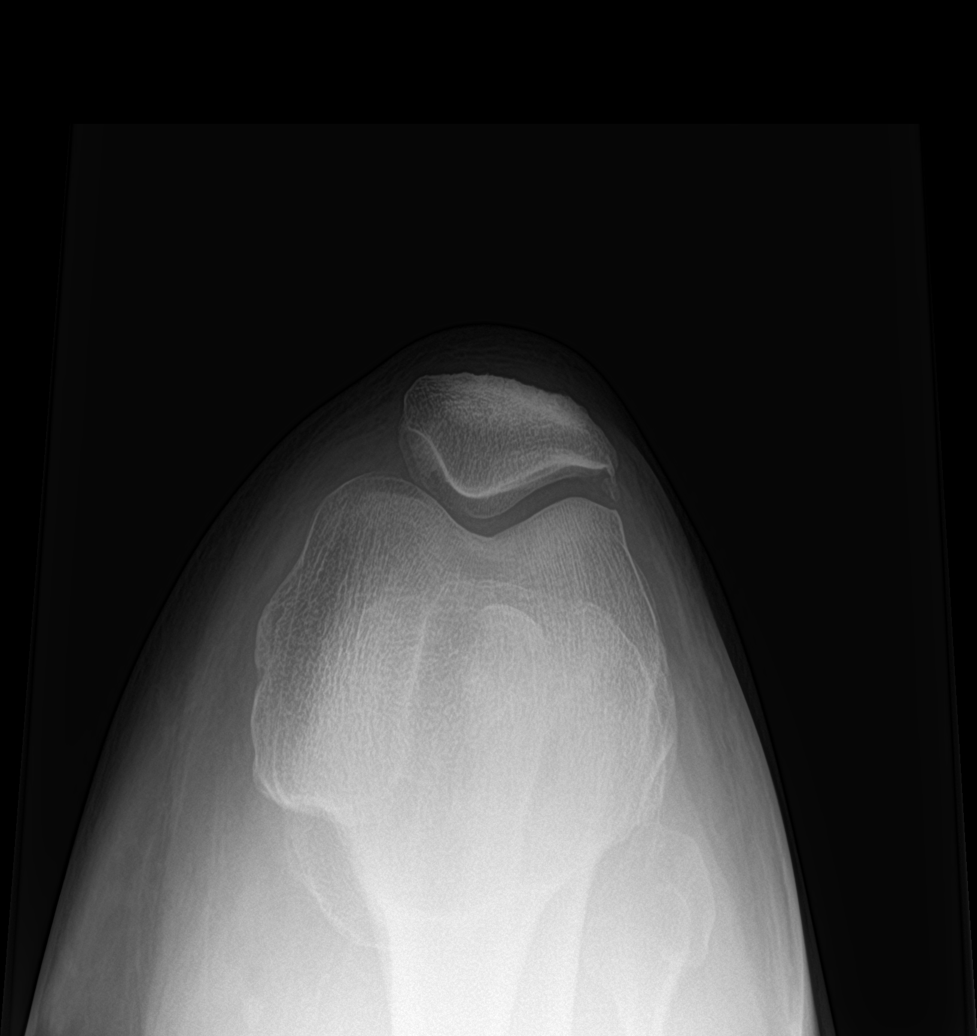

[knee ap bilat standing]
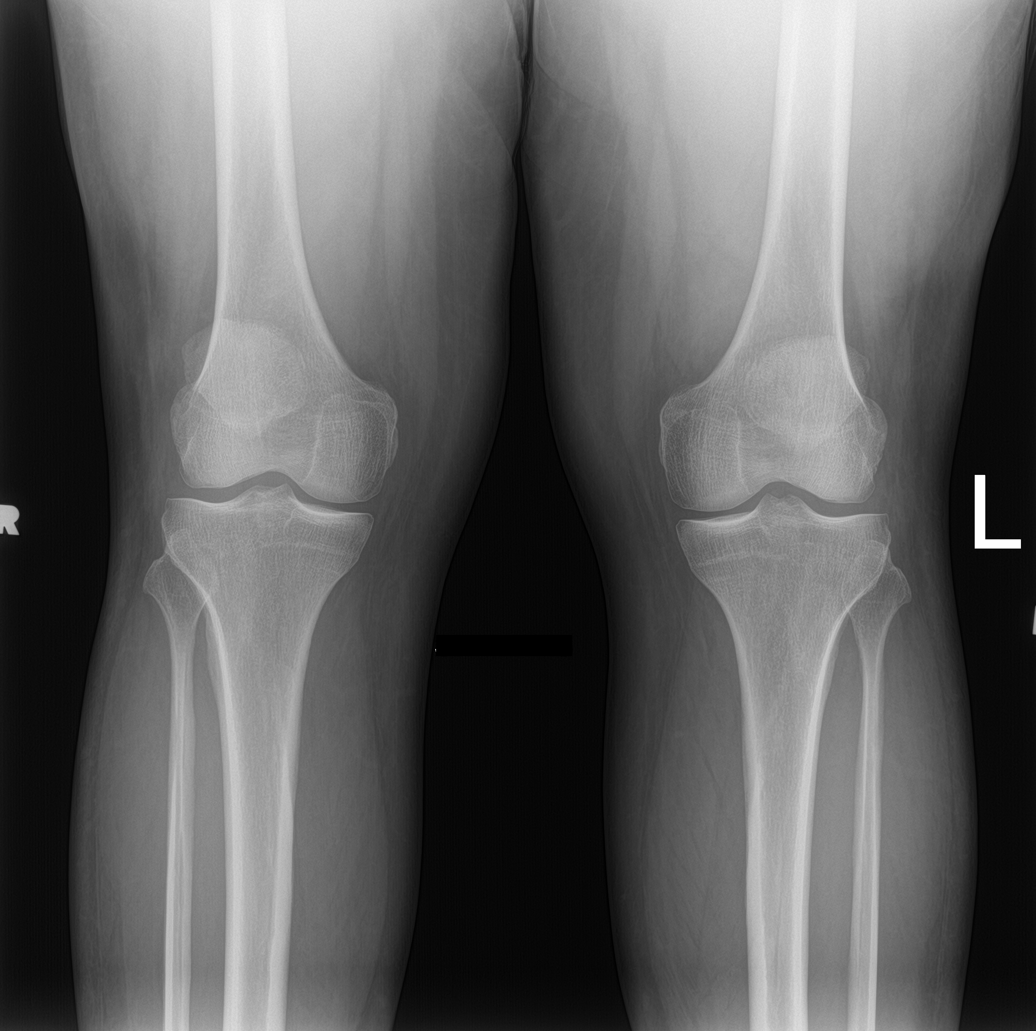

[4 of 4 positions shown; findings below may reference images not displayed]

FINDINGS: Weightbearing views. AP views of both knees. Bone mineralization is
within normal limits. Medial compartment joint space loss
bilaterally, appear slightly worse on the right. No acute osseous
abnormality identified. No right knee joint effusion. Mild right
side patellofemoral compartment degenerative spurring.
IMPRESSION: No acute osseous abnormality. Bilateral medial compartment joint
space loss, perhaps slightly worse on the right. Right
patellofemoral compartment degenerative spurring.
